# Patient Record
Sex: Female | Born: 1935 | Race: White | Hispanic: No | Marital: Married | Smoking: Never smoker
Health system: Southern US, Community
[De-identification: ages and names within clinical notes are randomized; demographics above are authoritative.]

## PROBLEM LIST (undated history)

## (undated) DIAGNOSIS — C50919 Malignant neoplasm of unspecified site of unspecified female breast: Secondary | ICD-10-CM

## (undated) DIAGNOSIS — R251 Tremor, unspecified: Secondary | ICD-10-CM

## (undated) DIAGNOSIS — G250 Essential tremor: Secondary | ICD-10-CM

## (undated) DIAGNOSIS — C760 Malignant neoplasm of head, face and neck: Secondary | ICD-10-CM

## (undated) DIAGNOSIS — E7800 Pure hypercholesterolemia, unspecified: Secondary | ICD-10-CM

## (undated) DIAGNOSIS — I10 Essential (primary) hypertension: Secondary | ICD-10-CM

## (undated) DIAGNOSIS — F319 Bipolar disorder, unspecified: Secondary | ICD-10-CM

## (undated) DIAGNOSIS — M1990 Unspecified osteoarthritis, unspecified site: Secondary | ICD-10-CM

## (undated) DIAGNOSIS — G629 Polyneuropathy, unspecified: Secondary | ICD-10-CM

## (undated) DIAGNOSIS — J45909 Unspecified asthma, uncomplicated: Secondary | ICD-10-CM

## (undated) HISTORY — DX: Polyneuropathy, unspecified: G62.9

## (undated) HISTORY — DX: Tremor, unspecified: R25.1

## (undated) HISTORY — DX: Malignant neoplasm of head, face and neck: C76.0

## (undated) HISTORY — DX: Malignant neoplasm of unspecified site of unspecified female breast: C50.919

## (undated) HISTORY — DX: Unspecified osteoarthritis, unspecified site: M19.90

## (undated) HISTORY — PX: ORIF TIBIAL SHAFT FRACTURE W/ PLATES AND SCREWS: SUR964

## (undated) HISTORY — DX: Bipolar disorder, unspecified: F31.9

## (undated) HISTORY — DX: Pure hypercholesterolemia, unspecified: E78.00

## (undated) HISTORY — DX: Essential (primary) hypertension: I10

## (undated) HISTORY — DX: Unspecified asthma, uncomplicated: J45.909

## (undated) HISTORY — PX: VAGINAL HYSTERECTOMY: SUR661

## (undated) HISTORY — DX: Essential tremor: G25.0

---

## 1996-11-23 DIAGNOSIS — C50919 Malignant neoplasm of unspecified site of unspecified female breast: Secondary | ICD-10-CM

## 1996-11-23 HISTORY — PX: BREAST LUMPECTOMY: SHX2

## 1996-11-23 HISTORY — DX: Malignant neoplasm of unspecified site of unspecified female breast: C50.919

## 2008-02-27 ENCOUNTER — Ambulatory Visit: Payer: Self-pay | Admitting: Family Medicine

## 2008-03-03 ENCOUNTER — Emergency Department: Payer: Self-pay | Admitting: Emergency Medicine

## 2008-03-03 ENCOUNTER — Other Ambulatory Visit: Payer: Self-pay

## 2008-11-12 ENCOUNTER — Ambulatory Visit: Payer: Self-pay | Admitting: Family Medicine

## 2009-04-15 ENCOUNTER — Ambulatory Visit: Payer: Self-pay | Admitting: Family Medicine

## 2009-07-26 ENCOUNTER — Ambulatory Visit: Payer: Self-pay | Admitting: Family Medicine

## 2010-02-06 ENCOUNTER — Ambulatory Visit: Payer: Self-pay | Admitting: Family Medicine

## 2010-05-29 ENCOUNTER — Ambulatory Visit: Payer: Self-pay | Admitting: Family Medicine

## 2011-01-06 ENCOUNTER — Ambulatory Visit: Payer: Self-pay

## 2011-02-04 ENCOUNTER — Ambulatory Visit: Payer: Self-pay | Admitting: Family Medicine

## 2011-07-02 ENCOUNTER — Ambulatory Visit: Payer: Self-pay | Admitting: Family Medicine

## 2011-07-06 ENCOUNTER — Ambulatory Visit: Payer: Self-pay | Admitting: Family Medicine

## 2012-10-04 ENCOUNTER — Ambulatory Visit: Payer: Self-pay | Admitting: Family Medicine

## 2012-10-11 ENCOUNTER — Ambulatory Visit: Payer: Self-pay | Admitting: Family Medicine

## 2013-03-17 ENCOUNTER — Telehealth: Payer: Self-pay

## 2013-03-17 NOTE — Telephone Encounter (Signed)
Pt has never been seen here before and has seen Dr. Katrinka Blazing before. She is wanting to discuss a recent diagnosis with her. She really misses Dr. Ival Bible Call back [phone]

## 2013-03-20 ENCOUNTER — Telehealth: Payer: Self-pay

## 2013-03-20 NOTE — Telephone Encounter (Signed)
Patient has been to Dr. Chandra Batch and Dr. Clelia Croft the neurologist. She misses Dr. Katrinka Blazing so much. Dr Clelia Croft is not big on medicine and did not think her condition with her hands was a big deal. Her condition has gotten worse this past year. She feels that it is starting to take away her quality of life. She feels that Dr. Clelia Croft did not properly treat her condition a year ago. Propranalol-Enderal was started a few days ago. She would like Dr. Katrinka Blazing to call her. She has an appt today with Dr. Clelia Croft today and is not wanting to go- she wants to confirm her feelings with Dr. Katrinka Blazing.

## 2013-03-20 NOTE — Telephone Encounter (Signed)
PT CALLED AGAIN WANTING TO LET YOU KNOW SHE LEFT THE NEUROLOGIST AND WOULD LIKE TO KNOW IF YOU COULD RECOMMEND A NEW ONE.  [phone]

## 2013-03-20 NOTE — Telephone Encounter (Signed)
PT STATES SHE IS A PT OF DR Katrinka Blazing WHEN SHE WAS IN [place] AND DR SMITH HAD REFERRED HER TO ANOTHER DR. SHE JUST WANT TO KNOW IF IT WAS UNUSUAL FOR THE DR TO WAIT A YEAR BEFORE THEY GAVE HER SOME MEDICINE. ALSO WANT DR Katrinka Blazing TO KNOW SHE MISS HER PLEASE CALL [phone]

## 2013-05-25 DIAGNOSIS — F319 Bipolar disorder, unspecified: Secondary | ICD-10-CM | POA: Insufficient documentation

## 2013-05-25 DIAGNOSIS — G629 Polyneuropathy, unspecified: Secondary | ICD-10-CM | POA: Insufficient documentation

## 2013-05-25 DIAGNOSIS — D329 Benign neoplasm of meninges, unspecified: Secondary | ICD-10-CM | POA: Insufficient documentation

## 2013-05-25 DIAGNOSIS — M1990 Unspecified osteoarthritis, unspecified site: Secondary | ICD-10-CM | POA: Insufficient documentation

## 2013-05-25 DIAGNOSIS — E7800 Pure hypercholesterolemia, unspecified: Secondary | ICD-10-CM | POA: Insufficient documentation

## 2013-05-25 DIAGNOSIS — J45909 Unspecified asthma, uncomplicated: Secondary | ICD-10-CM | POA: Insufficient documentation

## 2013-06-14 ENCOUNTER — Ambulatory Visit (INDEPENDENT_AMBULATORY_CARE_PROVIDER_SITE_OTHER): Payer: Medicare Other | Admitting: Neurology

## 2013-06-14 ENCOUNTER — Encounter: Payer: Self-pay | Admitting: Neurology

## 2013-06-14 VITALS — BP 134/85 | HR 70 | Ht 64.0 in | Wt 166.0 lb

## 2013-06-14 DIAGNOSIS — R42 Dizziness and giddiness: Secondary | ICD-10-CM

## 2013-06-14 DIAGNOSIS — R259 Unspecified abnormal involuntary movements: Secondary | ICD-10-CM

## 2013-06-14 DIAGNOSIS — G250 Essential tremor: Secondary | ICD-10-CM

## 2013-06-14 DIAGNOSIS — G609 Hereditary and idiopathic neuropathy, unspecified: Secondary | ICD-10-CM

## 2013-06-14 DIAGNOSIS — R251 Tremor, unspecified: Secondary | ICD-10-CM

## 2013-06-14 HISTORY — DX: Essential tremor: G25.0

## 2013-06-14 MED ORDER — TOPIRAMATE 50 MG PO TABS: ORAL_TABLET | ORAL | Status: DC

## 2013-06-14 NOTE — Patient Instructions (Addendum)
I think overall you are doing fairly well but I do want to suggest a few things today:  Remember to drink plenty of fluid, eat healthy meals and do not skip any meals. Try to eat protein with a every meal and eat a healthy snack such as fruit or nuts in between meals. Try to keep a regular sleep-wake schedule and try to exercise daily, particularly in the form of walking, 20-30 minutes a day, if you can.   As far as your medications are concerned, I would like to suggest Topamax 50 mg: Take half a pill each bedtime for one week, then one pill at bedtime daily for one week, then 1-1/2 pills at bedtime daily for one week, then 2 pills at bedtime daily thereafter. Common side effects reported are: Sedation, sleepiness, tingling, change in taste especially with carbonated drinks, and rare side effects are glaucoma, kidney stones, and problems with thinking including word finding difficulties.  As far as diagnostic testing: no new test from my end today.  I would like to see you back in 3 months, sooner if we need to. Please call us with any interim questions, concerns, problems, updates or refill requests.  Please also call us for any test results so we can go over those with you on the phone. Brett Canales is my clinical assistant and will answer any of your questions and relay your messages to me and also relay most of my messages to you.  Our phone number is [phone]. We also have an after hours call service for urgent matters and there is a physician on-call for urgent questions. For any emergencies you know to call 911 or go to the nearest emergency room.

## 2013-06-14 NOTE — Progress Notes (Signed)
Subjective:    Patient ID: Megan Flores is a 78 y.o. female.  HPI  Huston Foley, MD, PhD [facility] [address] [address] [place], [place] [address]  Dear Dr. Sherrie Mustache,   I saw your patient, Megan Flores, upon your kind request in my neurologic clinic today for initial consultation of her dizziness and tremors as well as neuropathy. The patient is unaccompanied today. As you know, Megan Flores is a very pleasant 78 year old right-handed woman with an underlying medical history of bipolar, peripheral neuropathy, hyperlipidemia, hypertension, tremor, and chronic lung disease as well as anxiety, who has been experiencing dizziness for the past 3 months. She has had recurrent headaches. She was recently started on propranolol by her neurologist for her tremors. She has not had any syncope or head trauma or recent illness or falls. She has had some low pulse rates. Her symptoms improved after she stopped the propranolol. There is no FHx of neuropathy She had a reaction to primidone and felt dizzy. She has not been on any medication for her tremors in the last 6 weeks, and she has also an intermittent head tremor. She has some balance issues, but has not fallen recently. She denies recurrent HAs. She has had a tremor for the past 2 years.   She has a remote Hx of meningioma and is due for a CTH with her new PCP soon.   Her Past Medical History Is Significant For: Past Medical History  Diagnosis Date  . Asthma   . Bipolar 1 disorder   . Peripheral neuropathy   . Osteoarthritis   . Cancer of breast   . Cancer of face   . HTN (hypertension)   . Hypercholesteremia   . Tremor     Her Past Surgical History Is Significant For: Past Surgical History  Procedure Laterality Date  . Vaginal hysterectomy      Her Family History Is Significant For: Family History  Problem Relation Age of Onset  . Cancer Mother   . Heart failure Father     Her  Social History Is Significant For: History   Social History  . Marital Status: Married    Spouse Name: N/A    Number of Children: N/A  . Years of Education: N/A   Social History Main Topics  . Smoking status: Never Smoker   . Smokeless tobacco: None  . Alcohol Use: No  . Drug Use: No  . Sexually Active: None   Other Topics Concern  . None   Social History Narrative  . None    Her Allergies Are:  Allergies  Allergen Reactions  . Primidone Other (See Comments)    dizziness  . Propranolol Anaphylaxis    hypotension  . Neosporin (Neomycin-Bacitracin Zn-Polymyx) Rash  . Polysporin (Bacitracin-Polymyxin B) Rash  :   Her Current Medications Are:  Outpatient Encounter Prescriptions as of 06/14/2013  Medication Sig Dispense Refill  . albuterol (PROVENTIL HFA;VENTOLIN HFA) 108 (90 BASE) MCG/ACT inhaler Inhale 2 puffs into the lungs every 6 (six) hours as needed for wheezing.      Marland Kitchen aspirin 81 MG tablet Take 81 mg by mouth daily.      . Calcium Citrate-Vitamin D (CITRACAL + D PO) Take 1 tablet by mouth 2 (two) times daily.      . carbamazepine (CARBATROL) 200 MG 12 hr capsule Take 200 mg by mouth 4 (four) times daily.      . diazepam (VALIUM) 5 MG tablet Take 5 mg  by mouth every 6 (six) hours as needed for anxiety.      . fish oil-omega-3 fatty acids 1000 MG capsule Take 2 g by mouth daily.      . Flaxseed, Linseed, (FLAX SEED OIL) 1000 MG CAPS Take 1 capsule by mouth 2 (two) times daily.      Marland Kitchen gabapentin (NEURONTIN) 300 MG capsule Take 300 mg by mouth 3 (three) times daily.      Marland Kitchen lisinopril (PRINIVIL,ZESTRIL) 5 MG tablet Take 5 mg by mouth daily.      . meclizine (ANTIVERT) 25 MG tablet Take 25 mg by mouth 4 (four) times daily.      . simvastatin (ZOCOR) 40 MG tablet Take 40 mg by mouth every evening.      Marland Kitchen Spacer/Aero-Holding Chambers (AEROCHAMBER PLUS) inhaler 1 each by Other route once. Use as instructed      . vitamin B-12 (CYANOCOBALAMIN) 500 MCG tablet Take 500 mcg by  mouth daily.      Marland Kitchen topiramate (TOPAMAX) 50 MG tablet 1/2 pill each bedtime x 1 week, then 1 pill nightly x 1 week, then 1-1/2 pills nightly x 1 week, then 2 pills nightly thereafter.  60 tablet  3   No facility-administered encounter medications on file as of 06/14/2013.   Review of Systems  HENT: Positive for hearing loss and tinnitus.   Neurological: Positive for tremors and numbness.    Objective:  Neurologic Exam  Physical Exam Physical Examination:   Filed Vitals:   06/14/13 1030  BP: 134/85  Pulse: 70    General Examination: The patient is a very pleasant 78 y.o. female in no acute distress. She appears well-developed and well-nourished and well groomed.   HEENT: Normocephalic, atraumatic, pupils are equal, round and reactive to light and accommodation. Funduscopic exam is normal with sharp disc margins noted. Extraocular tracking is good without limitation to gaze excursion or nystagmus noted. Normal smooth pursuit is noted. Hearing is mildly impaired. Tympanic membranes are clear bilaterally. Face is symmetric with normal facial animation and normal facial sensation. Speech is clear with no dysarthria noted. There is no hypophonia. There is no lip tremor, but she has a mild mild intermittent neck/head tremor. Neck is supple with full range of passive and active motion. There are no carotid bruits on auscultation. Oropharynx exam reveals: mild mouth dryness, adequate dental hygiene and no signignificant airway crowding. Mallampati is class II. Tongue protrudes centrally and palate elevates symmetrically.  Chest: Clear to auscultation without wheezing, rhonchi or crackles noted.  Heart: S1+S2+0, regular and normal without murmurs, rubs or gallops noted.   Abdomen: Soft, non-tender and non-distended with normal bowel sounds appreciated on auscultation.  Extremities: There is no pitting edema in the distal lower extremities bilaterally. Pedal pulses are intact.  Skin: Warm and  dry without trophic changes noted. There are no varicose veins.  Musculoskeletal: exam reveals no obvious joint deformities, tenderness or joint swelling or erythema.   Neurologically:  Mental status: The patient is awake, alert and oriented in all 4 spheres. Her memory, attention, language and knowledge are appropriate. There is no aphasia, agnosia, apraxia or anomia. Speech is clear with normal prosody and enunciation. Thought process is linear. Mood is congruent and affect is normal.  Cranial nerves are as described above under HEENT exam. In addition, shoulder shrug is normal with equal shoulder height noted. Motor exam: Normal bulk, strength and tone is noted. There is no drift, or rebound. She has no resting tremor. She has a  mild postural and action tremor in both UEs. On Archimedes spiral drawing, she has mild tremulousness and her handwriting is mildly tremulous and legible, and not micrographic. Romberg is negative except mild swaying. Reflexes are 1+ throughout except absent ankles. Toes are downgoing bilaterally. Fine motor skills are intact with normal finger taps, normal hand movements, normal rapid alternating patting, normal foot taps and normal foot agility.  Cerebellar testing shows no dysmetria or intention tremor on finger to nose testing. Heel to shin is unremarkable bilaterally. There is no truncal or gait ataxia.  Sensory exam is intact to light touch, pinprick, vibration, temperature sense and proprioception in the upper extremities with mild decrease in PP/Temp/vibration in the distal LEs to above the ankles.  Gait, station and balance are unremarkable. No veering to one side is noted. No leaning to one side is noted. Posture is age-appropriate and stance is narrow based. No problems turning are noted. She turns en bloc. Tandem walk is unremarkable. Intact toe and heel stance is noted.               Assessment and Plan:   In summary, Lorraina Spring is a very pleasant 78  y.o.-year old female with a history and physical exam c/w essential tremor. Her physical exam is stable and there is evidence of mild peripheral neuropathy, stable by history.   I had a long chat with the patient about my findings and the diagnosis of ET, the prognosis and treatment options. We talked about medical treatments and non-pharmacological approaches. We talked about maintaining a healthy lifestyle in general. I encouraged the patient to eat healthy, exercise daily and keep well hydrated, to keep a scheduled bedtime and wake time routine, to not skip any meals and eat healthy snacks in between meals and to have protein with every meal.   As far as further diagnostic testing is concerned, I suggested the following today: no new test. She is d/t have a CTH with her new PCP for a remote hx of meningioma.,  As far as medications are concerned, I recommended the following at this time: trial of Topamax, starting at 25 mg with gradual build up to 100 mg qHS. I advised her about potential SEs.  I answered all her questions today and the patient was in agreement with the above outlined plan. I would like to see the patient back in 3 months, sooner if the need arises and encouraged her to call with any interim questions, concerns, problems or updates and refill requests.   Thank you very much for allowing me to participate in the care of this nice patient. If I can be of any further assistance to you please do not hesitate to call me at [phone].  Sincerely,   Huston Foley, MD, PhD

## 2013-06-23 ENCOUNTER — Ambulatory Visit: Payer: Self-pay | Admitting: Family Medicine

## 2013-07-19 ENCOUNTER — Telehealth: Payer: Self-pay | Admitting: Neurology

## 2013-07-19 NOTE — Telephone Encounter (Signed)
FYI: Patient has decided to leave the practices and continue to go to [facility].

## 2013-09-29 ENCOUNTER — Ambulatory Visit: Payer: Medicare Other | Admitting: Neurology

## 2013-11-10 ENCOUNTER — Ambulatory Visit: Payer: Self-pay | Admitting: Family Medicine

## 2014-02-26 DIAGNOSIS — F05 Delirium due to known physiological condition: Secondary | ICD-10-CM | POA: Insufficient documentation

## 2014-03-03 DIAGNOSIS — F061 Catatonic disorder due to known physiological condition: Secondary | ICD-10-CM | POA: Insufficient documentation

## 2014-09-13 DIAGNOSIS — F5104 Psychophysiologic insomnia: Secondary | ICD-10-CM | POA: Insufficient documentation

## 2014-09-13 DIAGNOSIS — E871 Hypo-osmolality and hyponatremia: Secondary | ICD-10-CM | POA: Insufficient documentation

## 2014-12-04 ENCOUNTER — Ambulatory Visit: Payer: Self-pay | Admitting: Family Medicine

## 2015-05-01 ENCOUNTER — Ambulatory Visit
Admission: EM | Admit: 2015-05-01 | Discharge: 2015-05-01 | Disposition: A | Discharge status: Home / Self Care | Payer: Medicare Other

## 2015-05-01 DIAGNOSIS — S060X0A Concussion without loss of consciousness, initial encounter: Secondary | ICD-10-CM

## 2015-05-01 NOTE — ED Provider Notes (Signed)
CSN: [id number]     Arrival date & time 05/01/15  1856 History   None    Chief Complaint  Patient presents with  . Fall  . Head Injury   (Consider location/radiation/quality/duration/timing/severity/associated sxs/prior Treatment) HPI Patient presents for evaluation of head trauma. [place] [employer] [place] to [place] sustaining a contusion to the left side of her face and both knees. She denies any LOC, but complains of "not feeling quite right" and of feeling nauseated. She takes low-dose aspirin daily, but is otherwise not on anti-coagulants. She denies any loss of motor or sensory function. Pain is localized to the left temple and bilateral anterior knees without radiation.  Past Medical History  Diagnosis Date  . Asthma   . Bipolar 1 disorder   . Peripheral neuropathy   . Osteoarthritis   . Cancer of breast   . Cancer of face   . HTN (hypertension)   . Hypercholesteremia   . Tremor   . Essential tremor 06/14/2013   Past Surgical History  Procedure Laterality Date  . Vaginal hysterectomy     Family History  Problem Relation Age of Onset  . Cancer Mother   . Heart failure Father    History  Substance Use Topics  . Smoking status: Never Smoker   . Smokeless tobacco: Not on file  . Alcohol Use: No   OB History    No data available     Review of Systems  Constitutional: Negative for fever, chills, diaphoresis and fatigue.  HENT: Positive for facial swelling.   Eyes: Negative.   Respiratory: Negative.   Cardiovascular: Negative.   Gastrointestinal: Negative for nausea.  Musculoskeletal: Positive for joint swelling. Negative for back pain and neck pain.  Neurological: Positive for tremors. Negative for dizziness, seizures, syncope, speech difficulty, weakness, light-headedness, numbness and headaches.  Psychiatric/Behavioral: Negative.     Allergies  Primidone; Propranolol; Neosporin; Polysporin; and Topamax  Home Medications   Prior to Admission  medications   Medication Sig Start Date End Date Taking? Authorizing Provider  aspirin 81 MG tablet Take 81 mg by mouth daily.   Yes Historical Provider, MD  Calcium Citrate-Vitamin D (CITRACAL + D PO) Take 1 tablet by mouth 2 (two) times daily.   Yes Historical Provider, MD  carbamazepine (CARBATROL) 200 MG 12 hr capsule Take 200 mg by mouth 4 (four) times daily.   Yes Historical Provider, MD  fish oil-omega-3 fatty acids 1000 MG capsule Take 2 g by mouth daily.   Yes Historical Provider, MD  gabapentin (NEURONTIN) 300 MG capsule Take 300 mg by mouth 3 (three) times daily.   Yes Historical Provider, MD  simvastatin (ZOCOR) 40 MG tablet Take 40 mg by mouth every evening.   Yes Historical Provider, MD  vitamin B-12 (CYANOCOBALAMIN) 500 MCG tablet Take 500 mcg by mouth daily.   Yes Historical Provider, MD  albuterol (PROVENTIL HFA;VENTOLIN HFA) 108 (90 BASE) MCG/ACT inhaler Inhale 2 puffs into the lungs every 6 (six) hours as needed for wheezing.    Historical Provider, MD  diazepam (VALIUM) 5 MG tablet Take 5 mg by mouth every 6 (six) hours as needed for anxiety.    Historical Provider, MD  Flaxseed, Linseed, (FLAX SEED OIL) 1000 MG CAPS Take 1 capsule by mouth 2 (two) times daily.    Historical Provider, MD  lisinopril (PRINIVIL,ZESTRIL) 5 MG tablet Take 5 mg by mouth daily.    Historical Provider, MD  meclizine (ANTIVERT) 25 MG tablet Take 25 mg  by mouth 4 (four) times daily.    Historical Provider, MD  Spacer/Aero-Holding Chambers (AEROCHAMBER PLUS) inhaler 1 each by Other route once. Use as instructed    Historical Provider, MD  topiramate (TOPAMAX) 50 MG tablet 1/2 pill each bedtime x 1 week, then 1 pill nightly x 1 week, then 1-1/2 pills nightly x 1 week, then 2 pills nightly thereafter. 06/14/13   Star Age, MD   BP 154/69 mmHg  Pulse 72  Temp(Src) 97.7 F (36.5 C) (Oral)  Resp 16  Ht 5\' 4"  (1.626 m)  Wt 160 lb (72.576 kg)  BMI 27.45 kg/m2  SpO2 100% Physical Exam  Constitutional:  She is oriented to person, place, and time. She appears well-developed and well-nourished.  HENT:  Positive ecchymosis over the left temple with diffuse soft tissue swelling.  Eyes: EOM are normal. Pupils are equal, round, and reactive to light.  Neck: Normal range of motion.  Cardiovascular: Normal rate and regular rhythm.   Pulmonary/Chest: Effort normal and breath sounds normal.  Musculoskeletal:  Positive ecchymosis and soft tissue swelling over the left anterior knee. No instability or loss of motion.  Neurological: She is alert and oriented to person, place, and time. No cranial nerve deficit.  5/5 strength bilateral UE and bilateral LE  Skin: Skin is warm and dry.  Psychiatric: She has a normal mood and affect. Thought content normal.    ED Course  Procedures (including critical care time) Labs Review Labs Reviewed - No data to display  Imaging Review No results found.   MDM   1. Concussion, without loss of consciousness, initial encounter    Discussed injury and need for further imaging to rule out internal hemorrhage. Patient and husband understand she needs to be evaluated further at the ED and plan to proceed via private vehicle to [facility] for further evaluation.    Mathews Argyle, PA-C 05/01/15 2027

## 2015-05-01 NOTE — ED Notes (Signed)
States [place] and fell in [employer] parking [place] and hit left side of face. Denies LOC. + bruising. A&Ox3

## 2015-11-06 DIAGNOSIS — Z7189 Other specified counseling: Secondary | ICD-10-CM | POA: Insufficient documentation

## 2016-07-20 DIAGNOSIS — R296 Repeated falls: Secondary | ICD-10-CM | POA: Insufficient documentation

## 2016-12-02 DIAGNOSIS — R7301 Impaired fasting glucose: Secondary | ICD-10-CM | POA: Insufficient documentation

## 2017-07-20 ENCOUNTER — Other Ambulatory Visit: Payer: Self-pay | Admitting: Family Medicine

## 2017-07-20 DIAGNOSIS — Z1231 Encounter for screening mammogram for malignant neoplasm of breast: Secondary | ICD-10-CM

## 2017-07-29 ENCOUNTER — Other Ambulatory Visit: Payer: Self-pay | Admitting: Family Medicine

## 2017-07-29 DIAGNOSIS — N644 Mastodynia: Secondary | ICD-10-CM

## 2017-08-06 ENCOUNTER — Other Ambulatory Visit: Payer: Medicare Other

## 2017-08-12 ENCOUNTER — Ambulatory Visit
Admission: RE | Admit: 2017-08-12 | Discharge: 2017-08-12 | Disposition: A | Discharge status: Home / Self Care | Payer: Medicare Other | Source: Ambulatory Visit | Attending: Family Medicine | Admitting: Family Medicine

## 2017-08-12 DIAGNOSIS — R928 Other abnormal and inconclusive findings on diagnostic imaging of breast: Secondary | ICD-10-CM | POA: Insufficient documentation

## 2017-08-12 DIAGNOSIS — N644 Mastodynia: Secondary | ICD-10-CM | POA: Insufficient documentation

## 2017-08-12 HISTORY — DX: Malignant neoplasm of unspecified site of unspecified female breast: C50.919

## 2017-08-17 ENCOUNTER — Other Ambulatory Visit: Payer: Self-pay | Admitting: Family Medicine

## 2017-08-17 DIAGNOSIS — N6320 Unspecified lump in the left breast, unspecified quadrant: Secondary | ICD-10-CM

## 2017-08-17 DIAGNOSIS — R928 Other abnormal and inconclusive findings on diagnostic imaging of breast: Secondary | ICD-10-CM

## 2017-08-26 ENCOUNTER — Ambulatory Visit
Admission: RE | Admit: 2017-08-26 | Discharge: 2017-08-26 | Disposition: A | Discharge status: Home / Self Care | Payer: Medicare Other | Source: Ambulatory Visit | Attending: Family Medicine | Admitting: Family Medicine

## 2017-08-26 DIAGNOSIS — N6320 Unspecified lump in the left breast, unspecified quadrant: Secondary | ICD-10-CM | POA: Diagnosis present

## 2017-08-26 DIAGNOSIS — R928 Other abnormal and inconclusive findings on diagnostic imaging of breast: Secondary | ICD-10-CM

## 2017-08-26 DIAGNOSIS — N6324 Unspecified lump in the left breast, lower inner quadrant: Secondary | ICD-10-CM | POA: Diagnosis not present

## 2017-08-27 LAB — SURGICAL PATHOLOGY

## 2020-10-23 DIAGNOSIS — Z85828 Personal history of other malignant neoplasm of skin: Secondary | ICD-10-CM | POA: Insufficient documentation

## 2020-10-31 ENCOUNTER — Ambulatory Visit
Admission: RE | Admit: 2020-10-31 | Discharge: 2020-10-31 | Disposition: A | Discharge status: Home / Self Care | Payer: Self-pay | Source: Ambulatory Visit | Attending: Radiation Oncology | Admitting: Radiation Oncology

## 2020-10-31 ENCOUNTER — Encounter (INDEPENDENT_AMBULATORY_CARE_PROVIDER_SITE_OTHER): Payer: Self-pay

## 2020-10-31 ENCOUNTER — Ambulatory Visit
Admission: RE | Admit: 2020-10-31 | Discharge: 2020-10-31 | Disposition: A | Discharge status: Home / Self Care | Payer: Medicare Other | Source: Ambulatory Visit | Attending: Radiation Oncology | Admitting: Radiation Oncology

## 2020-10-31 ENCOUNTER — Other Ambulatory Visit: Payer: Self-pay

## 2020-10-31 ENCOUNTER — Other Ambulatory Visit: Payer: Self-pay | Admitting: *Deleted

## 2020-10-31 VITALS — BP 142/81 | HR 114 | Temp 97.5°F | Wt 151.0 lb

## 2020-10-31 DIAGNOSIS — M1990 Unspecified osteoarthritis, unspecified site: Secondary | ICD-10-CM | POA: Diagnosis not present

## 2020-10-31 DIAGNOSIS — F319 Bipolar disorder, unspecified: Secondary | ICD-10-CM | POA: Insufficient documentation

## 2020-10-31 DIAGNOSIS — R918 Other nonspecific abnormal finding of lung field: Secondary | ICD-10-CM | POA: Diagnosis not present

## 2020-10-31 DIAGNOSIS — Z853 Personal history of malignant neoplasm of breast: Secondary | ICD-10-CM | POA: Diagnosis not present

## 2020-10-31 DIAGNOSIS — Z79899 Other long term (current) drug therapy: Secondary | ICD-10-CM | POA: Insufficient documentation

## 2020-10-31 DIAGNOSIS — C44329 Squamous cell carcinoma of skin of other parts of face: Secondary | ICD-10-CM | POA: Diagnosis present

## 2020-10-31 DIAGNOSIS — Z7982 Long term (current) use of aspirin: Secondary | ICD-10-CM | POA: Diagnosis not present

## 2020-10-31 DIAGNOSIS — E7800 Pure hypercholesterolemia, unspecified: Secondary | ICD-10-CM | POA: Diagnosis not present

## 2020-10-31 DIAGNOSIS — J45909 Unspecified asthma, uncomplicated: Secondary | ICD-10-CM | POA: Insufficient documentation

## 2020-10-31 DIAGNOSIS — G629 Polyneuropathy, unspecified: Secondary | ICD-10-CM | POA: Diagnosis not present

## 2020-10-31 NOTE — Consult Note (Signed)
NEW PATIENT EVALUATION  Name: Megan Flores  MRN: [id number]  Date:   10/31/2020     DOB: September 03, 1935   This 85 y.o. female patient presents to the clinic for initial evaluation of squamous cell carcinoma the right temple.  REFERRING PHYSICIAN: Hortencia Pilar, MD  CHIEF COMPLAINT:  Chief Complaint  Patient presents with  . 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   PREVIOUS INVESTIGATIONS:  CT scans reviewed Pathology report reviewed Clinical notes reviewed  HPI: Patient is a an 85 year old female who presented with a increasing nodular mass in her right temple.  She underwent biopsy back in November showing moderate to poorly differentiated squamous cell carcinoma with infiltrative growth pattern and edge involvement.  Mass was not fixed to the temple.She underwent a CT scan of the soft tissue of the head and neck showing a 7 mm soft tissue density nodule in the right parotid gland which was nonspecific suspicious.  No cervical lymph nodes were noted.  She had some several small pulmonary nodules up to 7 mm although majority were 2 to 3 mm favored to be postinflammatory postinfectious.  She is declined Mohs surgeries now referred to radiation oncology for opinion.  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    PAST SURGICAL HISTORY:  Past Surgical History:  Procedure Laterality Date  . BREAST LUMPECTOMY Left 1998   pos  . VAGINAL HYSTERECTOMY      FAMILY HISTORY: family history includes Breast cancer (age of onset: 82) in her mother; Cancer in her mother; Heart failure in her father; Ovarian cancer (age of  onset: 96) in her sister.  SOCIAL HISTORY:  reports that she has never smoked. She does not have any smokeless tobacco history on file. She reports that she does not drink alcohol and does not use drugs.  ALLERGIES: Levofloxacin, Primidone, Propranolol, Neomycin-bacitracin-polymyxin [bacitracin-neomycin-polymyxin], Neosporin [neomycin-bacitracin zn-polymyx], Polysporin [bacitracin-polymyxin b], Aspirin, and Topamax [topiramate]  MEDICATIONS:  Current Outpatient Medications  Medication Sig Dispense Refill  . aspirin 81 MG tablet Take 81 mg by mouth daily.    . Calcium Citrate-Vitamin D (CITRACAL + D PO) Take 1 tablet by mouth 2 (two) times daily.    . divalproex (DEPAKOTE) 125 MG DR tablet Take 125 mg by mouth 2 (two) times daily.    . Flaxseed, Linseed, (FLAX SEED OIL) 1000 MG CAPS Take 1 capsule by mouth 2 (two) times daily.    Marland Kitchen lisinopril (PRINIVIL,ZESTRIL) 5 MG tablet Take 5 mg by mouth daily.    . simvastatin (ZOCOR) 40 MG tablet Take 40 mg by mouth daily.    . vitamin B-12 (CYANOCOBALAMIN) 500 MCG tablet Take 500 mcg by mouth daily.    Marland Kitchen albuterol (PROVENTIL HFA;VENTOLIN HFA) 108 (90 BASE) MCG/ACT inhaler Inhale 2 puffs into the lungs every 6 (six) hours as needed for wheezing. (Patient not taking: Reported on 10/31/2020)    . carbamazepine (CARBATROL) 200 MG 12 hr capsule Take 200 mg by mouth 4 (four) times daily. (Patient not taking: Reported on 10/31/2020)    . diazepam (VALIUM) 5 MG tablet Take 5 mg by mouth every 6 (six) hours as needed for anxiety. (Patient not taking: Reported on 10/31/2020)    .  fish oil-omega-3 fatty acids 1000 MG capsule Take 2 g by mouth daily. (Patient not taking: Reported on 10/31/2020)    . gabapentin (NEURONTIN) 300 MG capsule Take 300 mg by mouth 3 (three) times daily. (Patient not taking: Reported on 10/31/2020)    . meclizine (ANTIVERT) 25 MG tablet Take 25 mg by mouth 4 (four) times daily. (Patient not taking: Reported on 10/31/2020)    . Spacer/Aero-Holding  Chambers (AEROCHAMBER PLUS) inhaler 1 each by Other route once. Use as instructed    . topiramate (TOPAMAX) 50 MG tablet 1/2 pill each bedtime x 1 week, then 1 pill nightly x 1 week, then 1-1/2 pills nightly x 1 week, then 2 pills nightly thereafter. (Patient not taking: Reported on 10/31/2020) 60 tablet 3   No current facility-administered medications for this encounter.    ECOG PERFORMANCE STATUS:  0 - Asymptomatic  REVIEW OF SYSTEMS: Patient does have bipolar disorder. Patient denies any weight loss, fatigue, weakness, fever, chills or night sweats. Patient denies any loss of vision, blurred vision. Patient denies any ringing  of the ears or hearing loss. No irregular heartbeat. Patient denies heart murmur or history of fainting. Patient denies any chest pain or pain radiating to her upper extremities. Patient denies any shortness of breath, difficulty breathing at night, cough or hemoptysis. Patient denies any swelling in the lower legs. Patient denies any nausea vomiting, vomiting of blood, or coffee ground material in the vomitus. Patient denies any stomach pain. Patient states has had normal bowel movements no significant constipation or diarrhea. Patient denies any dysuria, hematuria or significant nocturia. Patient denies any problems walking, swelling in the joints or loss of balance. Patient denies any skin changes, loss of hair or loss of weight. Patient denies any excessive worrying or anxiety or significant depression. Patient denies any problems with insomnia. Patient denies excessive thirst, polyuria, polydipsia. Patient denies any swollen glands, patient denies easy bruising or easy bleeding. Patient denies any recent infections, allergies or URI. Patient "s visual fields have not changed significantly in recent time.   PHYSICAL EXAM: BP (!) 142/81   Pulse (!) 114   Temp (!) 97.5 F (36.4 C) (Tympanic)   Wt 151 lb (68.5 kg)   BMI 25.92 kg/m  Slightly raised slightly ulcerative mass  on the right temple nonfixed to the scalp.  No evidence of submental cervical or supraclavicular adenopathy.  Well-developed well-nourished patient in NAD. HEENT reveals PERLA, EOMI, discs not visualized.  Oral cavity is clear. No oral mucosal lesions are identified. Neck is clear without evidence of cervical or supraclavicular adenopathy. Lungs are clear to A&P. Cardiac examination is essentially unremarkable with regular rate and rhythm without murmur rub or thrill. Abdomen is benign with no organomegaly or masses noted. Motor sensory and DTR levels are equal and symmetric in the upper and lower extremities. Cranial nerves II through XII are grossly intact. Proprioception is intact. No peripheral adenopathy or edema is identified. No motor or sensory levels are noted. Crude visual fields are within normal range.  LABORATORY DATA: Pathology report reviewed    RADIOLOGY RESULTS: CT scan reviewed   IMPRESSION: Squamous cell carcinoma the right temple in an 85 year old female who has declined Mohs chemosurgery.  PLAN: At this time elect go with electron-beam therapy to her right temple.  Would plan on delivering 50 Gray over 5 weeks.  Risks and benefits of tree including skin reaction possible slight fatigue and hyperpigmentation of skin in this area all reviewed in detail with the patient.  She seems to comprehend my treatment plan well.  I have set her up for CT simulation early next week.  I would like to take this opportunity to thank you for allowing me to participate in the care of your patient.Noreene Filbert, MD

## 2020-11-05 ENCOUNTER — Ambulatory Visit
Admission: RE | Admit: 2020-11-05 | Discharge: 2020-11-05 | Disposition: A | Discharge status: Home / Self Care | Payer: Medicare Other | Source: Ambulatory Visit | Attending: Radiation Oncology | Admitting: Radiation Oncology

## 2020-11-05 DIAGNOSIS — C44329 Squamous cell carcinoma of skin of other parts of face: Secondary | ICD-10-CM | POA: Diagnosis present

## 2020-11-05 DIAGNOSIS — Z510 Encounter for antineoplastic radiation therapy: Secondary | ICD-10-CM | POA: Diagnosis not present

## 2020-11-06 DIAGNOSIS — C44329 Squamous cell carcinoma of skin of other parts of face: Secondary | ICD-10-CM | POA: Diagnosis not present

## 2020-11-12 ENCOUNTER — Ambulatory Visit
Admission: RE | Admit: 2020-11-12 | Discharge: 2020-11-12 | Disposition: A | Discharge status: Home / Self Care | Payer: Medicare Other | Source: Ambulatory Visit | Attending: Radiation Oncology | Admitting: Radiation Oncology

## 2020-11-12 DIAGNOSIS — C44329 Squamous cell carcinoma of skin of other parts of face: Secondary | ICD-10-CM | POA: Diagnosis not present

## 2020-11-13 ENCOUNTER — Ambulatory Visit
Admission: RE | Admit: 2020-11-13 | Discharge: 2020-11-13 | Disposition: A | Discharge status: Home / Self Care | Payer: Medicare Other | Source: Ambulatory Visit | Attending: Radiation Oncology | Admitting: Radiation Oncology

## 2020-11-13 DIAGNOSIS — C44329 Squamous cell carcinoma of skin of other parts of face: Secondary | ICD-10-CM | POA: Diagnosis not present

## 2020-11-14 ENCOUNTER — Ambulatory Visit
Admission: RE | Admit: 2020-11-14 | Discharge: 2020-11-14 | Disposition: A | Discharge status: Home / Self Care | Payer: Medicare Other | Source: Ambulatory Visit | Attending: Radiation Oncology | Admitting: Radiation Oncology

## 2020-11-14 DIAGNOSIS — C44329 Squamous cell carcinoma of skin of other parts of face: Secondary | ICD-10-CM | POA: Diagnosis not present

## 2020-11-18 ENCOUNTER — Ambulatory Visit
Admission: RE | Admit: 2020-11-18 | Discharge: 2020-11-18 | Disposition: A | Discharge status: Home / Self Care | Payer: Medicare Other | Source: Ambulatory Visit | Attending: Radiation Oncology | Admitting: Radiation Oncology

## 2020-11-18 DIAGNOSIS — C44329 Squamous cell carcinoma of skin of other parts of face: Secondary | ICD-10-CM | POA: Diagnosis not present

## 2020-11-19 ENCOUNTER — Ambulatory Visit
Admission: RE | Admit: 2020-11-19 | Discharge: 2020-11-19 | Disposition: A | Discharge status: Home / Self Care | Payer: Medicare Other | Source: Ambulatory Visit | Attending: Radiation Oncology | Admitting: Radiation Oncology

## 2020-11-19 DIAGNOSIS — C44329 Squamous cell carcinoma of skin of other parts of face: Secondary | ICD-10-CM | POA: Diagnosis not present

## 2020-11-20 ENCOUNTER — Ambulatory Visit
Admission: RE | Admit: 2020-11-20 | Discharge: 2020-11-20 | Disposition: A | Discharge status: Home / Self Care | Payer: Medicare Other | Source: Ambulatory Visit | Attending: Radiation Oncology | Admitting: Radiation Oncology

## 2020-11-20 DIAGNOSIS — C44329 Squamous cell carcinoma of skin of other parts of face: Secondary | ICD-10-CM | POA: Diagnosis not present

## 2020-11-21 ENCOUNTER — Ambulatory Visit
Admission: RE | Admit: 2020-11-21 | Discharge: 2020-11-21 | Disposition: A | Discharge status: Home / Self Care | Payer: Medicare Other | Source: Ambulatory Visit | Attending: Radiation Oncology | Admitting: Radiation Oncology

## 2020-11-21 DIAGNOSIS — C44329 Squamous cell carcinoma of skin of other parts of face: Secondary | ICD-10-CM | POA: Diagnosis not present

## 2020-11-25 ENCOUNTER — Ambulatory Visit
Admission: RE | Admit: 2020-11-25 | Discharge: 2020-11-25 | Disposition: A | Discharge status: Home / Self Care | Payer: Medicare Other | Source: Ambulatory Visit | Attending: Radiation Oncology | Admitting: Radiation Oncology

## 2020-11-25 DIAGNOSIS — C44329 Squamous cell carcinoma of skin of other parts of face: Secondary | ICD-10-CM | POA: Diagnosis not present

## 2020-11-25 DIAGNOSIS — Z510 Encounter for antineoplastic radiation therapy: Secondary | ICD-10-CM | POA: Insufficient documentation

## 2020-11-26 ENCOUNTER — Ambulatory Visit
Admission: RE | Admit: 2020-11-26 | Discharge: 2020-11-26 | Disposition: A | Discharge status: Home / Self Care | Payer: Medicare Other | Source: Ambulatory Visit | Attending: Radiation Oncology | Admitting: Radiation Oncology

## 2020-11-26 DIAGNOSIS — C44329 Squamous cell carcinoma of skin of other parts of face: Secondary | ICD-10-CM | POA: Diagnosis not present

## 2020-11-27 ENCOUNTER — Ambulatory Visit
Admission: RE | Admit: 2020-11-27 | Discharge: 2020-11-27 | Disposition: A | Discharge status: Home / Self Care | Payer: Medicare Other | Source: Ambulatory Visit | Attending: Radiation Oncology | Admitting: Radiation Oncology

## 2020-11-27 DIAGNOSIS — C44329 Squamous cell carcinoma of skin of other parts of face: Secondary | ICD-10-CM | POA: Diagnosis not present

## 2020-11-28 ENCOUNTER — Ambulatory Visit
Admission: RE | Admit: 2020-11-28 | Discharge: 2020-11-28 | Disposition: A | Discharge status: Home / Self Care | Payer: Medicare Other | Source: Ambulatory Visit | Attending: Radiation Oncology | Admitting: Radiation Oncology

## 2020-11-28 DIAGNOSIS — C44329 Squamous cell carcinoma of skin of other parts of face: Secondary | ICD-10-CM | POA: Diagnosis not present

## 2020-11-29 ENCOUNTER — Ambulatory Visit
Admission: RE | Admit: 2020-11-29 | Discharge: 2020-11-29 | Disposition: A | Discharge status: Home / Self Care | Payer: Medicare Other | Source: Ambulatory Visit | Attending: Radiation Oncology | Admitting: Radiation Oncology

## 2020-11-29 DIAGNOSIS — C44329 Squamous cell carcinoma of skin of other parts of face: Secondary | ICD-10-CM | POA: Diagnosis not present

## 2020-12-02 ENCOUNTER — Ambulatory Visit
Admission: RE | Admit: 2020-12-02 | Discharge: 2020-12-02 | Disposition: A | Discharge status: Home / Self Care | Payer: Medicare Other | Source: Ambulatory Visit | Attending: Radiation Oncology | Admitting: Radiation Oncology

## 2020-12-02 DIAGNOSIS — C44329 Squamous cell carcinoma of skin of other parts of face: Secondary | ICD-10-CM | POA: Diagnosis not present

## 2020-12-03 ENCOUNTER — Ambulatory Visit
Admission: RE | Admit: 2020-12-03 | Discharge: 2020-12-03 | Disposition: A | Discharge status: Home / Self Care | Payer: Medicare Other | Source: Ambulatory Visit | Attending: Radiation Oncology | Admitting: Radiation Oncology

## 2020-12-03 DIAGNOSIS — C44329 Squamous cell carcinoma of skin of other parts of face: Secondary | ICD-10-CM | POA: Diagnosis not present

## 2020-12-04 ENCOUNTER — Ambulatory Visit
Admission: RE | Admit: 2020-12-04 | Discharge: 2020-12-04 | Disposition: A | Discharge status: Home / Self Care | Payer: Medicare Other | Source: Ambulatory Visit | Attending: Radiation Oncology | Admitting: Radiation Oncology

## 2020-12-04 DIAGNOSIS — C44329 Squamous cell carcinoma of skin of other parts of face: Secondary | ICD-10-CM | POA: Diagnosis not present

## 2020-12-05 ENCOUNTER — Ambulatory Visit
Admission: RE | Admit: 2020-12-05 | Discharge: 2020-12-05 | Disposition: A | Discharge status: Home / Self Care | Payer: Medicare Other | Source: Ambulatory Visit | Attending: Radiation Oncology | Admitting: Radiation Oncology

## 2020-12-05 DIAGNOSIS — C44329 Squamous cell carcinoma of skin of other parts of face: Secondary | ICD-10-CM | POA: Diagnosis not present

## 2020-12-06 ENCOUNTER — Ambulatory Visit
Admission: RE | Admit: 2020-12-06 | Discharge: 2020-12-06 | Disposition: A | Discharge status: Home / Self Care | Payer: Medicare Other | Source: Ambulatory Visit | Attending: Radiation Oncology | Admitting: Radiation Oncology

## 2020-12-06 DIAGNOSIS — C44329 Squamous cell carcinoma of skin of other parts of face: Secondary | ICD-10-CM | POA: Diagnosis not present

## 2020-12-09 ENCOUNTER — Ambulatory Visit: Payer: Medicare Other

## 2020-12-10 ENCOUNTER — Ambulatory Visit
Admission: RE | Admit: 2020-12-10 | Discharge: 2020-12-10 | Disposition: A | Discharge status: Home / Self Care | Payer: Medicare Other | Source: Ambulatory Visit | Attending: Radiation Oncology | Admitting: Radiation Oncology

## 2020-12-10 DIAGNOSIS — C44329 Squamous cell carcinoma of skin of other parts of face: Secondary | ICD-10-CM | POA: Diagnosis not present

## 2020-12-11 ENCOUNTER — Ambulatory Visit
Admission: RE | Admit: 2020-12-11 | Discharge: 2020-12-11 | Disposition: A | Discharge status: Home / Self Care | Payer: Medicare Other | Source: Ambulatory Visit | Attending: Radiation Oncology | Admitting: Radiation Oncology

## 2020-12-11 DIAGNOSIS — C44329 Squamous cell carcinoma of skin of other parts of face: Secondary | ICD-10-CM | POA: Diagnosis not present

## 2020-12-12 ENCOUNTER — Ambulatory Visit
Admission: RE | Admit: 2020-12-12 | Discharge: 2020-12-12 | Disposition: A | Discharge status: Home / Self Care | Payer: Medicare Other | Source: Ambulatory Visit | Attending: Radiation Oncology | Admitting: Radiation Oncology

## 2020-12-12 DIAGNOSIS — C44329 Squamous cell carcinoma of skin of other parts of face: Secondary | ICD-10-CM | POA: Diagnosis not present

## 2020-12-13 ENCOUNTER — Ambulatory Visit
Admission: RE | Admit: 2020-12-13 | Discharge: 2020-12-13 | Disposition: A | Discharge status: Home / Self Care | Payer: Medicare Other | Source: Ambulatory Visit | Attending: Radiation Oncology | Admitting: Radiation Oncology

## 2020-12-13 DIAGNOSIS — C44329 Squamous cell carcinoma of skin of other parts of face: Secondary | ICD-10-CM | POA: Diagnosis not present

## 2020-12-16 ENCOUNTER — Ambulatory Visit
Admission: RE | Admit: 2020-12-16 | Discharge: 2020-12-16 | Disposition: A | Discharge status: Home / Self Care | Payer: Medicare Other | Source: Ambulatory Visit | Attending: Radiation Oncology | Admitting: Radiation Oncology

## 2020-12-16 DIAGNOSIS — C44329 Squamous cell carcinoma of skin of other parts of face: Secondary | ICD-10-CM | POA: Diagnosis not present

## 2020-12-17 ENCOUNTER — Ambulatory Visit
Admission: RE | Admit: 2020-12-17 | Discharge: 2020-12-17 | Disposition: A | Discharge status: Home / Self Care | Payer: Medicare Other | Source: Ambulatory Visit | Attending: Radiation Oncology | Admitting: Radiation Oncology

## 2020-12-17 DIAGNOSIS — C44329 Squamous cell carcinoma of skin of other parts of face: Secondary | ICD-10-CM | POA: Diagnosis not present

## 2020-12-18 ENCOUNTER — Ambulatory Visit: Payer: Medicare Other

## 2020-12-18 ENCOUNTER — Ambulatory Visit
Admission: RE | Admit: 2020-12-18 | Discharge: 2020-12-18 | Disposition: A | Discharge status: Home / Self Care | Payer: Medicare Other | Source: Ambulatory Visit | Attending: Radiation Oncology | Admitting: Radiation Oncology

## 2020-12-18 DIAGNOSIS — C44329 Squamous cell carcinoma of skin of other parts of face: Secondary | ICD-10-CM | POA: Diagnosis not present

## 2020-12-19 ENCOUNTER — Ambulatory Visit
Admission: RE | Admit: 2020-12-19 | Discharge: 2020-12-19 | Disposition: A | Discharge status: Home / Self Care | Payer: Medicare Other | Source: Ambulatory Visit | Attending: Radiation Oncology | Admitting: Radiation Oncology

## 2020-12-19 DIAGNOSIS — C44329 Squamous cell carcinoma of skin of other parts of face: Secondary | ICD-10-CM | POA: Diagnosis not present

## 2021-01-27 ENCOUNTER — Encounter: Payer: Self-pay | Admitting: Radiation Oncology

## 2021-01-27 ENCOUNTER — Ambulatory Visit
Admission: RE | Admit: 2021-01-27 | Discharge: 2021-01-27 | Disposition: A | Discharge status: Home / Self Care | Payer: Medicare Other | Source: Ambulatory Visit | Attending: Radiation Oncology | Admitting: Radiation Oncology

## 2021-01-27 ENCOUNTER — Other Ambulatory Visit: Payer: Self-pay

## 2021-01-27 DIAGNOSIS — R4189 Other symptoms and signs involving cognitive functions and awareness: Secondary | ICD-10-CM | POA: Insufficient documentation

## 2021-01-27 DIAGNOSIS — Z923 Personal history of irradiation: Secondary | ICD-10-CM | POA: Insufficient documentation

## 2021-01-27 DIAGNOSIS — C44329 Squamous cell carcinoma of skin of other parts of face: Secondary | ICD-10-CM | POA: Diagnosis not present

## 2021-01-27 NOTE — Progress Notes (Signed)
Radiation Oncology Follow up Note  Name: Megan Flores   Date:   01/27/2021 MRN:  [id number] DOB: [birth year]    This 86 y.o. female presents to the clinic today for 1 month follow-up status post radiation therapy for squamous cell carcinoma the right temple.  REFERRING PROVIDER: Hortencia Pilar, MD  HPI: Patient is an 86 year old female now seen at 1 month having completed radiation therapy with electron-beam for a squamous cell the right temple seen today in routine follow-up she is doing well she is had a complete response still slight area of erythema in the area of treatment otherwise no complaints.  Person accompanying patient today states that she has been somewhat off since radiation although patient has obvious cognitive decline which has been witnessed by both my staff and myself prior to initiating radiation therapy.  I have also explained electron-beam does not enter the brain is a very superficial beam. COMPLICATIONS OF TREATMENT: none  FOLLOW UP COMPLIANCE: keeps appointments   PHYSICAL EXAM:  BP (!) (P) 153/88 (BP Location: Left Arm, Patient Position: Sitting)   Pulse (P) 99   Temp (!) (P) 96.6 F (35.9 C)   Resp (P) 18   Wt (P) 153 lb 1.6 oz (69.4 kg)   BMI (P) 26.28 kg/m  No evidence of cervical or submental adenopathy is identified.  She has had a complete response in the area of the right temple with some residual erythematous change in skin noted.  Well-developed well-nourished patient in NAD. HEENT reveals PERLA, EOMI, discs not visualized.  Oral cavity is clear. No oral mucosal lesions are identified. Neck is clear without evidence of cervical or supraclavicular adenopathy. Lungs are clear to A&P. Cardiac examination is essentially unremarkable with regular rate and rhythm without murmur rub or thrill. Abdomen is benign with no organomegaly or masses noted. Motor sensory and DTR levels are equal and symmetric in the upper and lower extremities. Cranial nerves II  through XII are grossly intact. Proprioception is intact. No peripheral adenopathy or edema is identified. No motor or sensory levels are noted. Crude visual fields are within normal range.  RADIOLOGY RESULTS: No current films to review  PLAN: Present time patient is doing well with a complete response by electron-beam therapy to her squamous cell carcinoma.  I have instructed the person to contact her PMD about her cognitive issues.  I have also referring her to Dr. Nehemiah Massed for follow-up of her skin cancers.  I have asked to see her back in 4 to 5 months for follow-up.  Family knows to call with any concerns.  I would like to take this opportunity to thank you for allowing me to participate in the care of your patient.Noreene Filbert, MD

## 2021-05-08 ENCOUNTER — Ambulatory Visit: Payer: Medicare Other | Admitting: Dermatology

## 2021-07-10 ENCOUNTER — Ambulatory Visit: Payer: Medicare Other | Admitting: Radiation Oncology

## 2021-07-30 ENCOUNTER — Encounter (HOSPITAL_COMMUNITY): Payer: Self-pay

## 2021-07-30 ENCOUNTER — Emergency Department (HOSPITAL_COMMUNITY): Payer: Medicare Other

## 2021-07-30 ENCOUNTER — Emergency Department (HOSPITAL_COMMUNITY)
Admission: EM | Admit: 2021-07-30 | Discharge: 2021-07-31 | Disposition: A | Discharge status: Home / Self Care | Payer: Medicare Other | Attending: Emergency Medicine | Admitting: Emergency Medicine

## 2021-07-30 DIAGNOSIS — Z85828 Personal history of other malignant neoplasm of skin: Secondary | ICD-10-CM | POA: Insufficient documentation

## 2021-07-30 DIAGNOSIS — Z853 Personal history of malignant neoplasm of breast: Secondary | ICD-10-CM | POA: Insufficient documentation

## 2021-07-30 DIAGNOSIS — S0003XA Contusion of scalp, initial encounter: Secondary | ICD-10-CM | POA: Insufficient documentation

## 2021-07-30 DIAGNOSIS — Z7951 Long term (current) use of inhaled steroids: Secondary | ICD-10-CM | POA: Diagnosis not present

## 2021-07-30 DIAGNOSIS — I10 Essential (primary) hypertension: Secondary | ICD-10-CM | POA: Diagnosis not present

## 2021-07-30 DIAGNOSIS — Z79899 Other long term (current) drug therapy: Secondary | ICD-10-CM | POA: Insufficient documentation

## 2021-07-30 DIAGNOSIS — I5189 Other ill-defined heart diseases: Secondary | ICD-10-CM | POA: Insufficient documentation

## 2021-07-30 DIAGNOSIS — E871 Hypo-osmolality and hyponatremia: Secondary | ICD-10-CM | POA: Diagnosis not present

## 2021-07-30 DIAGNOSIS — S0990XA Unspecified injury of head, initial encounter: Secondary | ICD-10-CM | POA: Diagnosis present

## 2021-07-30 DIAGNOSIS — W182XXA Fall in (into) shower or empty bathtub, initial encounter: Secondary | ICD-10-CM | POA: Diagnosis not present

## 2021-07-30 DIAGNOSIS — Z7982 Long term (current) use of aspirin: Secondary | ICD-10-CM | POA: Insufficient documentation

## 2021-07-30 DIAGNOSIS — J45909 Unspecified asthma, uncomplicated: Secondary | ICD-10-CM | POA: Insufficient documentation

## 2021-07-30 DIAGNOSIS — R600 Localized edema: Secondary | ICD-10-CM | POA: Insufficient documentation

## 2021-07-30 DIAGNOSIS — W19XXXA Unspecified fall, initial encounter: Secondary | ICD-10-CM

## 2021-07-30 DIAGNOSIS — M25521 Pain in right elbow: Secondary | ICD-10-CM | POA: Insufficient documentation

## 2021-07-30 LAB — CBC WITH DIFFERENTIAL/PLATELET
Abs Immature Granulocytes: 0.04 10*3/uL (ref 0.00–0.07)
Basophils Absolute: 0.1 10*3/uL (ref 0.0–0.1)
Basophils Relative: 1 %
Eosinophils Absolute: 0.2 10*3/uL (ref 0.0–0.5)
Eosinophils Relative: 2 %
HCT: 42.8 % (ref 36.0–46.0)
Hemoglobin: 14 g/dL (ref 12.0–15.0)
Immature Granulocytes: 0 %
Lymphocytes Relative: 20 %
Lymphs Abs: 2 10*3/uL (ref 0.7–4.0)
MCH: 28.5 pg (ref 26.0–34.0)
MCHC: 32.7 g/dL (ref 30.0–36.0)
MCV: 87.2 fL (ref 80.0–100.0)
Monocytes Absolute: 1.5 10*3/uL — ABNORMAL HIGH (ref 0.1–1.0)
Monocytes Relative: 15 %
Neutro Abs: 6.3 10*3/uL (ref 1.7–7.7)
Neutrophils Relative %: 62 %
Platelets: 254 10*3/uL (ref 150–400)
RBC: 4.91 MIL/uL (ref 3.87–5.11)
RDW: 13.2 % (ref 11.5–15.5)
WBC: 10.1 10*3/uL (ref 4.0–10.5)
nRBC: 0 % (ref 0.0–0.2)

## 2021-07-30 NOTE — ED Triage Notes (Signed)
Patient arrives via EMS from [facility] after witnessed fall out of bathtub. Patient did hit head on right side, no LOC. No deformity noted and no blood thinners.  EMS vitals: BP 160/82 Hx HTN, HR 83, SPO2 99%, RR 16, CBG 103

## 2021-07-30 NOTE — ED Provider Notes (Signed)
[facility] DEPT Provider Note   CSN: [id number] Arrival date & time: 07/30/21  2200     History Chief Complaint  Patient presents with   Megan Flores is a 86 y.o. female with a hx of hypertension, hypercholesterolemia, asthma, breast cancer S/p lumpectomy, bipolar disorder, frequent falls, and peripheral neuropathy who presents to the ED via EMS from facility S/p mechanical fall just PTA with complaints of headache. Patient states she slipped getting out of the bath tub and hit her head, did not have LOC. Per EMS staff witnessed event- no LOC, @ mental status baseline. Patient complaints of right sided headache and right elbow pain. Elbow pain worse with movement, no alleviating factors. She currently feels like she has to urinate, but does not necessarily have abdominal pain. Denies chest pain, vomiting, hip pain, back pain, or neck pain. No anticoagulation per EMS. Patient reports her neuropathy & LE edema are chronic.   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[place])    Essential tremor 06/14/2013   HTN (hypertension)    Hypercholesteremia    Osteoarthritis    Peripheral neuropathy    Tremor     Patient Active Problem List   Diagnosis Date Noted   Personal history of other malignant neoplasm of skin 10/23/2020   Fasting hyperglycemia 12/02/2016   Frequent falls 07/20/2016   Advanced directives, counseling/discussion 11/06/2015   Chronic hyponatremia 09/13/2014   Chronic insomnia 09/13/2014   Catatonia 03/03/2014   Delirium due to multiple etiologies, acute, hyperactive 02/26/2014   Essential tremor 06/14/2013   Reactive airway disease 05/25/2013   Bipolar 1 disorder ([place]) 05/25/2013   Meningioma ([place]) 05/25/2013   Osteoarthritis 05/25/2013   Peripheral neuropathy 05/25/2013    Pure hypercholesterolemia 05/25/2013    Past Surgical History:  Procedure Laterality Date   BREAST LUMPECTOMY Left 1998   pos   VAGINAL HYSTERECTOMY       OB History   No obstetric history on file.     Family History  Problem Relation Age of Onset   Cancer Mother    Breast cancer Mother 21   Heart failure Father    Ovarian cancer Sister 20    Social History   Tobacco Use   Smoking status: Never   Smokeless tobacco: Never  Substance Use Topics   Alcohol use: No   Drug use: No    Home Medications Prior to Admission medications   Medication Sig Start Date End Date Taking? Authorizing Provider  albuterol (PROVENTIL HFA;VENTOLIN HFA) 108 (90 BASE) MCG/ACT inhaler Inhale 2 puffs into the lungs every 6 (six) hours as needed for wheezing. Patient not taking: No sig reported    [provider]  aspirin 81 MG tablet Take 81 mg by mouth daily.    [provider]  Calcium Citrate-Vitamin D (CITRACAL + D PO) Take 1 tablet by mouth 2 (two) times daily.    [provider]  carbamazepine (CARBATROL) 200 MG 12 hr capsule Take 200 mg by mouth 4 (four) times daily. Patient not taking: No sig reported    [provider]  clonazePAM (KLONOPIN) 0.5 MG tablet Take 0.5 mg by mouth daily as needed. 01/24/21   [provider]  diazepam (VALIUM) 5 MG tablet Take  5 mg by mouth every 6 (six) hours as needed for anxiety. Patient not taking: No sig reported    [provider]  divalproex (DEPAKOTE) 125 MG DR tablet Take 125 mg by mouth 2 (two) times daily.    [provider]  fish oil-omega-3 fatty acids 1000 MG capsule Take 2 g by mouth daily. Patient not taking: No sig reported    [provider]  Flaxseed, Linseed, (FLAX SEED OIL) 1000 MG CAPS Take 1 capsule by mouth 2 (two) times daily.    [provider]  gabapentin (NEURONTIN) 300 MG capsule Take 300 mg by mouth 3 (three) times daily. Patient not taking: No sig  reported    [provider]  hydrochlorothiazide (HYDRODIURIL) 25 MG tablet Take 25 mg by mouth daily. 01/24/21   [provider]  lisinopril (PRINIVIL,ZESTRIL) 5 MG tablet Take 5 mg by mouth daily.    [provider]  losartan (COZAAR) 25 MG tablet Take 25 mg by mouth daily. 12/18/20   [provider]  meclizine (ANTIVERT) 25 MG tablet Take 25 mg by mouth 4 (four) times daily.    [provider]  QUEtiapine (SEROQUEL) 100 MG tablet Take 100 mg by mouth at bedtime. 12/16/20   [provider]  simvastatin (ZOCOR) 40 MG tablet Take 40 mg by mouth daily. 10/07/20   [provider]  Spacer/Aero-Holding Chambers (AEROCHAMBER PLUS) inhaler 1 each by Other route once. Use as instructed    [provider]  topiramate (TOPAMAX) 50 MG tablet 1/2 pill each bedtime x 1 week, then 1 pill nightly x 1 week, then 1-1/2 pills nightly x 1 week, then 2 pills nightly thereafter. Patient not taking: No sig reported 06/14/13   Star Age, MD  vitamin B-12 (CYANOCOBALAMIN) 500 MCG tablet Take 500 mcg by mouth daily.    [provider]    Allergies    Levofloxacin, Primidone, Propranolol, Neomycin-bacitracin-polymyxin [bacitracin-neomycin-polymyxin], Neosporin [neomycin-bacitracin zn-polymyx], Polysporin [bacitracin-polymyxin b], Aspirin, and Topamax [topiramate]  Review of Systems   Review of Systems  Constitutional:  Negative for chills and fever.  Eyes:  Negative for visual disturbance.  Respiratory:  Negative for cough and shortness of breath.   Cardiovascular:  Positive for leg swelling (chronic). Negative for chest pain.  Gastrointestinal:  Negative for abdominal pain and vomiting.  Musculoskeletal:  Positive for arthralgias.  Neurological:  Positive for headaches.       Positive for chronic neuropathy.   All other systems reviewed and are negative.  Physical Exam Updated Vital Signs BP (!) 150/66   Pulse 80   Temp 98.7 F  (37.1 C) (Oral)   Resp 14   SpO2 99%   Physical Exam Vitals and nursing note reviewed.  Constitutional:      General: She is not in acute distress.    Appearance: She is not toxic-appearing.  HENT:     Head:     Comments: R parietal scalp hematoma. No active bleeding. No racoon eyes or battle sign.     Ears:     Comments: No hemotympanum.  Eyes:     Extraocular Movements: Extraocular movements intact.     Pupils: Pupils are equal, round, and reactive to light.  Cardiovascular:     Rate and Rhythm: Normal rate and regular rhythm.     Comments: 2+ radial pulses.  Pulmonary:     Effort: Pulmonary effort is normal.     Breath sounds: Normal breath sounds.  Chest:     Chest  wall: No tenderness.  Abdominal:     General: There is no distension.     Palpations: Abdomen is soft.     Tenderness: There is no abdominal tenderness. There is no guarding or rebound.  Musculoskeletal:     Cervical back: Normal range of motion and neck supple. No tenderness.     Right lower leg: Edema present.     Left lower leg: Edema present.     Comments: Ues: Abrasion to the right posterior elbow. No active bleeding or SQ tissue exposure. Intact AROM thorughout major joints of the upper extremities with the exception of right elbow and shoulder flexion limited secondary to pain in the elbow per patient. Tender over the right posterior elbow. Otherwise nontender.  Back: No point/focal vertebral tenderness.  Les: Edema to bilateral lower extremities which is symmetric.  Able to lift bilateral lower extremities off of the stretcher without difficulty, patient able to fully flex and extend the knees and fully plantar/dorsiflex ankles, she is able to move all digits.  She has no tenderness to palpation throughout.  Skin:    General: Skin is warm and dry.  Neurological:     Mental Status: She is alert.     Comments: Alert. Clear speech. No focal droop. Sensation grossly intact to light touch x 4. 5/5 grip  strength & strength with plantar/dorsiflexion bilaterally.     ED Results / Procedures / Treatments   Labs (all labs ordered are listed, but only abnormal results are displayed) Labs Reviewed  CBC WITH DIFFERENTIAL/PLATELET - Abnormal; Notable for the following components:      Result Value   Monocytes Absolute 1.5 (*)    All other components within normal limits  URINALYSIS, ROUTINE W REFLEX MICROSCOPIC - Abnormal; Notable for the following components:   Color, Urine COLORLESS (*)    Specific Gravity, Urine 1.003 (*)    pH 9.0 (*)    Hgb urine dipstick SMALL (*)    All other components within normal limits  COMPREHENSIVE METABOLIC PANEL - Abnormal; Notable for the following components:   Sodium 133 (*)    Chloride 96 (*)    Glucose, Bld 115 (*)    All other components within normal limits    EKG None  Radiology DG Shoulder Right  Result Date: 07/30/2021 CLINICAL DATA:  Fall EXAM: RIGHT SHOULDER - 2+ VIEW COMPARISON:  None. FINDINGS: Degenerative changes in the right [facility] joint. No acute bony abnormality. Specifically, no fracture, subluxation, or dislocation. IMPRESSION: No acute bony abnormality. Electronically Signed   By: Rolm Baptise M.D.   On: 07/30/2021 23:11   DG Elbow Complete Right  Result Date: 07/30/2021 CLINICAL DATA:  Witnessed fall out bathtub. EXAM: RIGHT ELBOW - COMPLETE 3+ VIEW COMPARISON:  None. FINDINGS: There is no evidence of fracture, dislocation, or joint effusion. Small chronically fragmented olecranon spur. There is no evidence of arthropathy or other focal bone abnormality. Soft tissues are unremarkable. IMPRESSION: No fracture or subluxation of the right elbow. Electronically Signed   By: Keith Rake M.D.   On: 07/30/2021 22:56   CT Head Wo Contrast  Result Date: 07/30/2021 CLINICAL DATA:  Head trauma, minor (Age >= 65y). Witnessed fall from bathtub with head injury EXAM: CT HEAD WITHOUT CONTRAST CT CERVICAL SPINE WITHOUT CONTRAST TECHNIQUE:  Multidetector CT imaging of the head and cervical spine was performed following the standard protocol without intravenous contrast. Multiplanar CT image reconstructions of the cervical spine were also generated. 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 A polypoid intraventricular mass is seen within the atrium of the right lateral ventricle, new from prior examination, demonstrating speckled calcification possibly representing a a choroid plexus neoplasm, such as a choroid plexus papilloma, or ependymoma. This measures 15 x 20 by 19 mm. Mild ventriculomegaly is present, slightly progressive since prior examination, but commensurate with the degree of parenchymal atrophy and likely reflecting the sequela of central atrophy. No definite evidence of hydrocephalus. Mild periventricular white matter changes are present likely reflecting the sequela of small vessel ischemia. No abnormal mass effect or midline shift. No acute intracranial hemorrhage or infarct. The cerebellum is unremarkable. Vascular: No asymmetric hyperdense vasculature at the skull base. Skull: Intact. Sinuses/Orbits: The paranasal sinuses are clear. The orbits are unremarkable. Other: Mastoid air cells and middle ear cavities are clear. Small right parietal scalp hematoma is present. CT CERVICAL SPINE FINDINGS Alignment: Normal cervical lordosis. 2 mm anterolisthesis of C2 upon C3 is likely degenerative in nature. Skull base and vertebrae: Craniocervical alignment is normal. The atlantodental interval is not widened. No acute fracture of the cervical spine. Vertebral body height has been preserved. Soft tissues and spinal canal: No prevertebral fluid or swelling. No visible canal hematoma. Disc levels: There is intervertebral disc space narrowing and endplate remodeling at 075-GRM  and to a lesser extent C4-5 and C6-7 in keeping with changes of mild degenerative disc disease. The prevertebral soft tissues are not thickened on sagittal reformats. Review of the axial images demonstrates multilevel uncovertebral and facet arthrosis resulting in mild right neuroforaminal narrowing at C5-6. Remaining neural foramina are widely patent. Spinal canal is widely patent. Upper chest: Unremarkable Other: None IMPRESSION: No acute intracranial injury. No calvarial fracture. Small right parietal scalp hematoma. Slight interval increase in size of left tentorial mass within the posterior fossa most in keeping with a meningioma. No significant mass effect upon the adjacent cerebellar hemisphere. Interval development of an intraventricular mass within the atrium of the right lateral ventricle with associated calcification possibly representing a choroid plexus neoplasm or ependymoma. Nonemergent dedicated contrast enhanced MRI examination is recommended for further characterization. No definite evidence of associated hydrocephalus. Mild senescent change. No acute fracture or listhesis of the cervical spine. Electronically Signed   By: Fidela Salisbury M.D.   On: 07/30/2021 23:35   CT Cervical Spine Wo Contrast  Result Date: 07/30/2021 CLINICAL DATA:  Head trauma, minor (Age >= 65y). Witnessed fall from bathtub with head injury EXAM: CT HEAD WITHOUT CONTRAST CT CERVICAL SPINE WITHOUT CONTRAST TECHNIQUE: Multidetector CT imaging of the head and cervical spine was performed following the standard protocol without intravenous contrast. Multiplanar CT image reconstructions of the cervical spine were also generated. 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 A polypoid intraventricular mass is seen within the atrium of the right lateral  ventricle, new from prior examination, demonstrating speckled calcification possibly representing a a choroid plexus neoplasm, such as a choroid plexus papilloma, or ependymoma. This measures 15 x 20 by 19 mm. Mild ventriculomegaly is present, slightly progressive since prior examination, but commensurate with the degree of parenchymal atrophy and likely reflecting the sequela of central atrophy. No definite evidence of hydrocephalus. Mild periventricular white matter changes are present  likely reflecting the sequela of small vessel ischemia. No abnormal mass effect or midline shift. No acute intracranial hemorrhage or infarct. The cerebellum is unremarkable. Vascular: No asymmetric hyperdense vasculature at the skull base. Skull: Intact. Sinuses/Orbits: The paranasal sinuses are clear. The orbits are unremarkable. Other: Mastoid air cells and middle ear cavities are clear. Small right parietal scalp hematoma is present. CT CERVICAL SPINE FINDINGS Alignment: Normal cervical lordosis. 2 mm anterolisthesis of C2 upon C3 is likely degenerative in nature. Skull base and vertebrae: Craniocervical alignment is normal. The atlantodental interval is not widened. No acute fracture of the cervical spine. Vertebral body height has been preserved. Soft tissues and spinal canal: No prevertebral fluid or swelling. No visible canal hematoma. Disc levels: There is intervertebral disc space narrowing and endplate remodeling at 075-GRM and to a lesser extent C4-5 and C6-7 in keeping with changes of mild degenerative disc disease. The prevertebral soft tissues are not thickened on sagittal reformats. Review of the axial images demonstrates multilevel uncovertebral and facet arthrosis resulting in mild right neuroforaminal narrowing at C5-6. Remaining neural foramina are widely patent. Spinal canal is widely patent. Upper chest: Unremarkable Other: None IMPRESSION: No acute intracranial injury. No calvarial fracture. Small right parietal  scalp hematoma. Slight interval increase in size of left tentorial mass within the posterior fossa most in keeping with a meningioma. No significant mass effect upon the adjacent cerebellar hemisphere. Interval development of an intraventricular mass within the atrium of the right lateral ventricle with associated calcification possibly representing a choroid plexus neoplasm or ependymoma. Nonemergent dedicated contrast enhanced MRI examination is recommended for further characterization. No definite evidence of associated hydrocephalus. Mild senescent change. No acute fracture or listhesis of the cervical spine. Electronically Signed   By: Fidela Salisbury M.D.   On: 07/30/2021 23:35    Procedures Procedures   Medications Ordered in ED Medications  acetaminophen (TYLENOL) tablet 650 mg (650 mg Oral Given 07/31/21 0202)  LORazepam (ATIVAN) injection 0.5 mg (0.5 mg Intravenous Given 07/31/21 [id number])    ED Course  I have reviewed the triage vital signs and the nursing notes.  Pertinent labs & imaging results that were available during my care of the patient were reviewed by me and considered in my medical decision making (see chart for details).    MDM Rules/Calculators/A&P                           Patient presents to the ED for evaluation status post fall.  Nontoxic, mildly hypertensive, vitals otherwise unremarkable.  Additional history obtained:  Additional history obtained from chart review & nursing note review.   Lab Tests:  I Ordered, reviewed, and interpreted labs, which included:  CBC: Fairly unremarkable CMP: Mild hyponatremia/chloremia, renal function preserved. Urinalysis: Small hemoglobin present, 0-5 RBC, no UTI.  Imaging Studies ordered:  I ordered imaging studies which included CT head/Cspine & x-rays of the right elbow/shoulder, I independently reviewed, formal radiology impression shows:  CT head/Cspine: No acute intracranial injury. No calvarial fracture. Small right  parietal scalp hematoma. Slight interval increase in size of left tentorial mass within the posterior fossa most in keeping with a meningioma. No significant mass effect upon the adjacent cerebellar hemisphere. Interval development of an intraventricular mass within the atrium of the right lateral ventricle with associated calcification possibly representing a choroid plexus neoplasm or ependymoma. Nonemergent dedicated contrast enhanced MRI examination is recommended for further characterization. No definite evidence of associated hydrocephalus. Mild senescent  change. No acute fracture or listhesis of the cervical spine.  R shoulder x-ray: No acute bony abnormality.  R elbow x- ray: No fracture or subluxation of the right elbow.  ED Course:  No signs of serious head, neck, or back injury.  CT head without bleed, CT cervical spine without fracture, no point/focal midline vertebral tenderness or focal neurologic deficits.  No chest/abdominal tenderness on initial or repeat exam.  Right upper extremity x-rays are negative for fracture or dislocation, patient is neurovascularly intact distally.  She is able to lift her lower extremities off of the bed without difficulty.  She is able to ambulate short distances without assistance and is able to weight-bear therefore I do not suspect a hip fracture at this time.  Patient did become agitated while waiting for results, she takes benzodiazepines, she was given 0.5 mg of IV Ativan with improvement.  Her CT scan of her head does show interval development of an intraventricular mass within the atrium of the right lateral ventricle possibly representing a choroid plexus neoplasm or ependymoma-patient will need close outpatient follow-up with MRI for further assessment.  I discussed results, treatment plan, need for follow-up, and return precautions with the patient and her daughter via telephone. Provided opportunity for questions,confirmed understanding and are in  agreement with plan.    Findings and plan of care discussed with supervising physician Dr. Gilford Raid who has evaluated the patient & is in agreement.    Portions of this note were generated with [employer]. Dictation errors may occur despite best attempts at proofreading.  Final Clinical Impression(s) / ED Diagnoses Final diagnoses:  Fall, initial encounter  Hematoma of right parietal scalp, initial encounter  Right ventricular mass    Rx / DC Orders ED Discharge Orders     None        Amaryllis Dyke, PA-C 07/31/21 [id number]    Isla Pence, MD 08/02/21 1513

## 2021-07-31 LAB — URINALYSIS, ROUTINE W REFLEX MICROSCOPIC
Bacteria, UA: NONE SEEN
Bilirubin Urine: NEGATIVE
Glucose, UA: NEGATIVE mg/dL
Ketones, ur: NEGATIVE mg/dL
Leukocytes,Ua: NEGATIVE
Nitrite: NEGATIVE
Protein, ur: NEGATIVE mg/dL
Specific Gravity, Urine: 1.003 — ABNORMAL LOW (ref 1.005–1.030)
pH: 9 — ABNORMAL HIGH (ref 5.0–8.0)

## 2021-07-31 LAB — COMPREHENSIVE METABOLIC PANEL
ALT: 13 U/L (ref 0–44)
AST: 22 U/L (ref 15–41)
Albumin: 3.7 g/dL (ref 3.5–5.0)
Alkaline Phosphatase: 53 U/L (ref 38–126)
Anion gap: 11 (ref 5–15)
BUN: 13 mg/dL (ref 8–23)
CO2: 26 mmol/L (ref 22–32)
Calcium: 9.1 mg/dL (ref 8.9–10.3)
Chloride: 96 mmol/L — ABNORMAL LOW (ref 98–111)
Creatinine, Ser: 0.71 mg/dL (ref 0.44–1.00)
GFR, Estimated: >60 mL/min (ref >60)
Glucose, Bld: 115 mg/dL — ABNORMAL HIGH (ref 70–99)
Potassium: 4.2 mmol/L (ref 3.5–5.1)
Sodium: 133 mmol/L — ABNORMAL LOW (ref 135–145)
Total Bilirubin: 0.6 mg/dL (ref 0.3–1.2)
Total Protein: 6.7 g/dL (ref 6.5–8.1)

## 2021-07-31 MED ORDER — ACETAMINOPHEN 325 MG PO TABS
650.0000 mg | ORAL_TABLET | Freq: Once | ORAL | Status: AC
  Administered 2021-07-31: 650 mg via ORAL
  Filled 2021-07-31: qty 2

## 2021-07-31 MED ORDER — LORAZEPAM 2 MG/ML IJ SOLN
0.5000 mg | Freq: Once | INTRAMUSCULAR | Status: AC
  Administered 2021-07-31: 0.5 mg via INTRAVENOUS
  Filled 2021-07-31: qty 1

## 2021-07-31 NOTE — ED Notes (Signed)
[facility] contacted. Transport arranged for patient back to facility.

## 2021-07-31 NOTE — ED Notes (Addendum)
Patient was able to get up and stand with x 1 assist. Pt was assisted to wheelchair and taken to bathroom. Pt was able to ambulate from hallway into the bathroom and back to wheelchair from bathroom without assistance. Gait was unsteady. Pt endorses using a cane to ambulate at home.

## 2021-07-31 NOTE — Discharge Instructions (Addendum)
You were seen in the emergency department today after a fall.  Your imaging did not show any signs of injury.  Your labs did show that your sodium and your chloride were very mildly low and that she did have some hemoglobin in your urine, please have this rechecked by your primary care provider.  Your CT scan of your head showed that you have a small hematoma on your scalp.  Your CT scan of your head also showed some increase in size of your likely meningioma that has been seen on prior imaging.  You do however have a new mass in your brain to your right ventricle, it is unclear exactly what this is, this may represent a cancerous process, it is very important that you follow-up with your primary care provider to have further evaluation of this with an MRI with contrast.  Please follow-up with primary care soon as possible.  Return to the emergency department for new or worsening symptoms including but not limited to new or worsening pain, chest pain, abdominal pain, blood in your urine or stool, passing out, change in mental status, fever, inability to keep fluids down, trouble walking, or any other concerns.

## 2021-08-07 ENCOUNTER — Other Ambulatory Visit: Payer: Self-pay

## 2021-08-07 ENCOUNTER — Emergency Department (HOSPITAL_COMMUNITY)
Admission: EM | Admit: 2021-08-07 | Discharge: 2021-08-08 | Discharge status: Against Medical Advice | Payer: Medicare Other | Source: Home / Self Care

## 2021-08-07 ENCOUNTER — Emergency Department (HOSPITAL_COMMUNITY): Payer: Medicare Other

## 2021-08-07 ENCOUNTER — Encounter (HOSPITAL_COMMUNITY): Payer: Self-pay | Admitting: Emergency Medicine

## 2021-08-07 DIAGNOSIS — M25552 Pain in left hip: Secondary | ICD-10-CM | POA: Insufficient documentation

## 2021-08-07 DIAGNOSIS — M25551 Pain in right hip: Secondary | ICD-10-CM | POA: Insufficient documentation

## 2021-08-07 DIAGNOSIS — R519 Headache, unspecified: Secondary | ICD-10-CM | POA: Insufficient documentation

## 2021-08-07 DIAGNOSIS — Z5321 Procedure and treatment not carried out due to patient leaving prior to being seen by health care provider: Secondary | ICD-10-CM | POA: Insufficient documentation

## 2021-08-07 DIAGNOSIS — E871 Hypo-osmolality and hyponatremia: Secondary | ICD-10-CM | POA: Diagnosis not present

## 2021-08-07 DIAGNOSIS — R4182 Altered mental status, unspecified: Secondary | ICD-10-CM | POA: Diagnosis not present

## 2021-08-07 LAB — CBC WITH DIFFERENTIAL/PLATELET
Abs Immature Granulocytes: 0.03 10*3/uL (ref 0.00–0.07)
Basophils Absolute: 0.1 10*3/uL (ref 0.0–0.1)
Basophils Relative: 1 %
Eosinophils Absolute: 0.2 10*3/uL (ref 0.0–0.5)
Eosinophils Relative: 2 %
HCT: 40.3 % (ref 36.0–46.0)
Hemoglobin: 13 g/dL (ref 12.0–15.0)
Immature Granulocytes: 0 %
Lymphocytes Relative: 26 %
Lymphs Abs: 2.3 10*3/uL (ref 0.7–4.0)
MCH: 28.3 pg (ref 26.0–34.0)
MCHC: 32.3 g/dL (ref 30.0–36.0)
MCV: 87.6 fL (ref 80.0–100.0)
Monocytes Absolute: 1.2 10*3/uL — ABNORMAL HIGH (ref 0.1–1.0)
Monocytes Relative: 14 %
Neutro Abs: 5 10*3/uL (ref 1.7–7.7)
Neutrophils Relative %: 57 %
Platelets: 274 10*3/uL (ref 150–400)
RBC: 4.6 MIL/uL (ref 3.87–5.11)
RDW: 13.6 % (ref 11.5–15.5)
WBC: 8.9 10*3/uL (ref 4.0–10.5)
nRBC: 0 % (ref 0.0–0.2)

## 2021-08-07 LAB — COMPREHENSIVE METABOLIC PANEL
ALT: 10 U/L (ref 0–44)
AST: 16 U/L (ref 15–41)
Albumin: 3.8 g/dL (ref 3.5–5.0)
Alkaline Phosphatase: 51 U/L (ref 38–126)
Anion gap: 11 (ref 5–15)
BUN: 17 mg/dL (ref 8–23)
CO2: 25 mmol/L (ref 22–32)
Calcium: 9.4 mg/dL (ref 8.9–10.3)
Chloride: 94 mmol/L — ABNORMAL LOW (ref 98–111)
Creatinine, Ser: 0.76 mg/dL (ref 0.44–1.00)
GFR, Estimated: >60 mL/min (ref >60)
Glucose, Bld: 97 mg/dL (ref 70–99)
Potassium: 4 mmol/L (ref 3.5–5.1)
Sodium: 130 mmol/L — ABNORMAL LOW (ref 135–145)
Total Bilirubin: 0.8 mg/dL (ref 0.3–1.2)
Total Protein: 6.7 g/dL (ref 6.5–8.1)

## 2021-08-07 NOTE — ED Triage Notes (Addendum)
Patient here from [facility], patient with multiple falls this week.  This is the third fall this week.  Patient having bilateral hip pain and having headache, did hit head.  Patient also with UTI odor.

## 2021-08-07 NOTE — ED Provider Notes (Signed)
Emergency Medicine Provider Triage Evaluation Note  HANORA SCRUTON , a 86 y.o. female  was evaluated in triage.  Pt complains of fall at hte facility pta. Per ems, pt with multiple falls. Pt reports pain to hips and head.  Review of Systems  Positive: Head pain, hip pain Negative: vomiting  Physical Exam  There were no vitals taken for this visit. Gen:   Awake, no distress   Resp:  Normal effort  MSK:   Moves extremities without difficulty  Other:  Clear speech, ttp to the bilat hips  Medical Decision Making  Medically screening exam initiated at 9:52 PM.  Appropriate orders placed.  Judianne Juhas Molinelli was informed that the remainder of the evaluation will be completed by another provider, this initial triage assessment does not replace that evaluation, and the importance of remaining in the ED until their evaluation is complete.     Bishop Dublin 08/07/21 2153    Daleen Bo, MD 08/09/21 1153

## 2021-08-08 NOTE — ED Notes (Signed)
Pt husband came in and gave her her morning meds pt was drinking soda in lobby. Pt husband is now sitting with pt because she is a fall risk

## 2021-08-08 NOTE — ED Notes (Signed)
Pt now wanting to leave with husband

## 2021-08-08 NOTE — ED Notes (Addendum)
Pt want to stay and informed husband.

## 2021-08-08 NOTE — ED Notes (Signed)
Daughter Elie Confer [phone] would like an update

## 2021-08-09 ENCOUNTER — Inpatient Hospital Stay (HOSPITAL_COMMUNITY)
Admission: EM | Admit: 2021-08-09 | Discharge: 2021-08-11 | DRG: 641 | Disposition: A | Discharge status: Nursing Home (Includes ICF) | Payer: Medicare Other | Source: Skilled Nursing Facility | Attending: Internal Medicine | Admitting: Internal Medicine

## 2021-08-09 ENCOUNTER — Emergency Department (HOSPITAL_COMMUNITY): Payer: Medicare Other

## 2021-08-09 DIAGNOSIS — Z820 Family history of epilepsy and other diseases of the nervous system: Secondary | ICD-10-CM

## 2021-08-09 DIAGNOSIS — Z853 Personal history of malignant neoplasm of breast: Secondary | ICD-10-CM

## 2021-08-09 DIAGNOSIS — R680 Hypothermia, not associated with low environmental temperature: Secondary | ICD-10-CM | POA: Diagnosis present

## 2021-08-09 DIAGNOSIS — E872 Acidosis: Secondary | ICD-10-CM | POA: Diagnosis present

## 2021-08-09 DIAGNOSIS — W06XXXA Fall from bed, initial encounter: Secondary | ICD-10-CM | POA: Diagnosis present

## 2021-08-09 DIAGNOSIS — E7800 Pure hypercholesterolemia, unspecified: Secondary | ICD-10-CM | POA: Diagnosis present

## 2021-08-09 DIAGNOSIS — E871 Hypo-osmolality and hyponatremia: Principal | ICD-10-CM | POA: Diagnosis present

## 2021-08-09 DIAGNOSIS — Z85828 Personal history of other malignant neoplasm of skin: Secondary | ICD-10-CM

## 2021-08-09 DIAGNOSIS — E875 Hyperkalemia: Secondary | ICD-10-CM | POA: Diagnosis present

## 2021-08-09 DIAGNOSIS — J45909 Unspecified asthma, uncomplicated: Secondary | ICD-10-CM | POA: Diagnosis present

## 2021-08-09 DIAGNOSIS — Y92092 Bedroom in other non-institutional residence as the place of occurrence of the external cause: Secondary | ICD-10-CM

## 2021-08-09 DIAGNOSIS — G250 Essential tremor: Secondary | ICD-10-CM | POA: Diagnosis present

## 2021-08-09 DIAGNOSIS — F0390 Unspecified dementia without behavioral disturbance: Secondary | ICD-10-CM | POA: Diagnosis present

## 2021-08-09 DIAGNOSIS — M1990 Unspecified osteoarthritis, unspecified site: Secondary | ICD-10-CM | POA: Diagnosis present

## 2021-08-09 DIAGNOSIS — I10 Essential (primary) hypertension: Secondary | ICD-10-CM | POA: Diagnosis present

## 2021-08-09 DIAGNOSIS — Z8249 Family history of ischemic heart disease and other diseases of the circulatory system: Secondary | ICD-10-CM

## 2021-08-09 DIAGNOSIS — F319 Bipolar disorder, unspecified: Secondary | ICD-10-CM | POA: Diagnosis present

## 2021-08-09 DIAGNOSIS — Z90710 Acquired absence of both cervix and uterus: Secondary | ICD-10-CM

## 2021-08-09 DIAGNOSIS — Z9181 History of falling: Secondary | ICD-10-CM

## 2021-08-09 DIAGNOSIS — G609 Hereditary and idiopathic neuropathy, unspecified: Secondary | ICD-10-CM | POA: Diagnosis present

## 2021-08-09 DIAGNOSIS — Z20822 Contact with and (suspected) exposure to covid-19: Secondary | ICD-10-CM | POA: Diagnosis present

## 2021-08-09 DIAGNOSIS — D329 Benign neoplasm of meninges, unspecified: Secondary | ICD-10-CM | POA: Diagnosis present

## 2021-08-09 DIAGNOSIS — R4182 Altered mental status, unspecified: Secondary | ICD-10-CM | POA: Diagnosis present

## 2021-08-09 DIAGNOSIS — Z803 Family history of malignant neoplasm of breast: Secondary | ICD-10-CM

## 2021-08-09 DIAGNOSIS — Z79899 Other long term (current) drug therapy: Secondary | ICD-10-CM

## 2021-08-09 DIAGNOSIS — T68XXXA Hypothermia, initial encounter: Secondary | ICD-10-CM

## 2021-08-09 LAB — VALPROIC ACID LEVEL: Valproic Acid Lvl: 54 ug/mL (ref 50.0–100.0)

## 2021-08-09 LAB — COMPREHENSIVE METABOLIC PANEL
ALT: 8 U/L (ref 0–44)
AST: 42 U/L — ABNORMAL HIGH (ref 15–41)
Albumin: 3.6 g/dL (ref 3.5–5.0)
Alkaline Phosphatase: 48 U/L (ref 38–126)
Anion gap: 12 (ref 5–15)
BUN: 21 mg/dL (ref 8–23)
CO2: 24 mmol/L (ref 22–32)
Calcium: 9.4 mg/dL (ref 8.9–10.3)
Chloride: 97 mmol/L — ABNORMAL LOW (ref 98–111)
Creatinine, Ser: 0.96 mg/dL (ref 0.44–1.00)
GFR, Estimated: 59 mL/min — ABNORMAL LOW (ref >60)
Glucose, Bld: 105 mg/dL — ABNORMAL HIGH (ref 70–99)
Potassium: 5.3 mmol/L — ABNORMAL HIGH (ref 3.5–5.1)
Sodium: 133 mmol/L — ABNORMAL LOW (ref 135–145)
Total Bilirubin: 1.1 mg/dL (ref 0.3–1.2)
Total Protein: 6.3 g/dL — ABNORMAL LOW (ref 6.5–8.1)

## 2021-08-09 LAB — CBC WITH DIFFERENTIAL/PLATELET
Abs Immature Granulocytes: 0.03 10*3/uL (ref 0.00–0.07)
Basophils Absolute: 0 10*3/uL (ref 0.0–0.1)
Basophils Relative: 0 %
Eosinophils Absolute: 0.1 10*3/uL (ref 0.0–0.5)
Eosinophils Relative: 1 %
HCT: 42.3 % (ref 36.0–46.0)
Hemoglobin: 13.5 g/dL (ref 12.0–15.0)
Immature Granulocytes: 0 %
Lymphocytes Relative: 16 %
Lymphs Abs: 1.2 10*3/uL (ref 0.7–4.0)
MCH: 28.2 pg (ref 26.0–34.0)
MCHC: 31.9 g/dL (ref 30.0–36.0)
MCV: 88.3 fL (ref 80.0–100.0)
Monocytes Absolute: 1.4 10*3/uL — ABNORMAL HIGH (ref 0.1–1.0)
Monocytes Relative: 18 %
Neutro Abs: 4.8 10*3/uL (ref 1.7–7.7)
Neutrophils Relative %: 65 %
Platelets: 227 10*3/uL (ref 150–400)
RBC: 4.79 MIL/uL (ref 3.87–5.11)
RDW: 13.6 % (ref 11.5–15.5)
WBC: 7.5 10*3/uL (ref 4.0–10.5)
nRBC: 0 % (ref 0.0–0.2)

## 2021-08-09 LAB — TROPONIN I (HIGH SENSITIVITY)
Troponin I (High Sensitivity): 6 ng/L (ref <18)
Troponin I (High Sensitivity): 5 ng/L (ref <18)

## 2021-08-09 LAB — URINALYSIS, COMPLETE (UACMP) WITH MICROSCOPIC
Bacteria, UA: NONE SEEN
Bilirubin Urine: NEGATIVE
Glucose, UA: NEGATIVE mg/dL
Hgb urine dipstick: NEGATIVE
Ketones, ur: NEGATIVE mg/dL
Nitrite: NEGATIVE
Protein, ur: NEGATIVE mg/dL
RBC / HPF: NONE SEEN RBC/hpf (ref 0–5)
Specific Gravity, Urine: <1.005 — ABNORMAL LOW (ref 1.005–1.030)
pH: 6.5 (ref 5.0–8.0)

## 2021-08-09 LAB — ETHANOL: Alcohol, Ethyl (B): <10 mg/dL (ref <10)

## 2021-08-09 LAB — RESP PANEL BY RT-PCR (FLU A&B, COVID) ARPGX2
Influenza A by PCR: NEGATIVE
Influenza B by PCR: NEGATIVE
SARS Coronavirus 2 by RT PCR: NEGATIVE

## 2021-08-09 LAB — VITAMIN B12: Vitamin B-12: 461 pg/mL (ref 180–914)

## 2021-08-09 LAB — BRAIN NATRIURETIC PEPTIDE: B Natriuretic Peptide: 24.5 pg/mL (ref 0.0–100.0)

## 2021-08-09 LAB — AMMONIA: Ammonia: 23 umol/L (ref 9–35)

## 2021-08-09 LAB — CBG MONITORING, ED: Glucose-Capillary: 105 mg/dL — ABNORMAL HIGH (ref 70–99)

## 2021-08-09 LAB — LACTIC ACID, PLASMA
Lactic Acid, Venous: 2.5 mmol/L (ref 0.5–1.9)
Lactic Acid, Venous: 1.9 mmol/L (ref 0.5–1.9)

## 2021-08-09 LAB — TSH: TSH: 1.901 u[IU]/mL (ref 0.350–4.500)

## 2021-08-09 MED ORDER — SODIUM CHLORIDE 0.9 % IV SOLN
2.0000 g | Freq: Two times a day (BID) | INTRAVENOUS | Status: DC
  Administered 2021-08-10 – 2021-08-11 (×3): 2 g via INTRAVENOUS
  Filled 2021-08-09 (×3): qty 2

## 2021-08-09 MED ORDER — VANCOMYCIN HCL 1750 MG/350ML IV SOLN: 1750.0000 mg | INTRAVENOUS | Status: DC

## 2021-08-09 MED ORDER — ACETAMINOPHEN 325 MG PO TABS: 650.0000 mg | ORAL_TABLET | Freq: Four times a day (QID) | ORAL | Status: DC | PRN

## 2021-08-09 MED ORDER — DIVALPROEX SODIUM 250 MG PO DR TAB
250.0000 mg | DELAYED_RELEASE_TABLET | Freq: Two times a day (BID) | ORAL | Status: DC
  Administered 2021-08-09 – 2021-08-11 (×4): 250 mg via ORAL
  Filled 2021-08-09 (×5): qty 1

## 2021-08-09 MED ORDER — ENOXAPARIN SODIUM 40 MG/0.4ML IJ SOSY
40.0000 mg | PREFILLED_SYRINGE | INTRAMUSCULAR | Status: DC
  Administered 2021-08-09 – 2021-08-10 (×2): 40 mg via SUBCUTANEOUS
  Filled 2021-08-09 (×2): qty 0.4

## 2021-08-09 MED ORDER — ACETAMINOPHEN 650 MG RE SUPP: 650.0000 mg | Freq: Four times a day (QID) | RECTAL | Status: DC | PRN

## 2021-08-09 MED ORDER — METRONIDAZOLE 500 MG/100ML IV SOLN
500.0000 mg | Freq: Once | INTRAVENOUS | Status: AC
  Administered 2021-08-09: 500 mg via INTRAVENOUS
  Filled 2021-08-09: qty 100

## 2021-08-09 MED ORDER — CEFEPIME HCL 2 G IJ SOLR
2.0000 g | Freq: Once | INTRAMUSCULAR | Status: AC
  Administered 2021-08-09: 2 g via INTRAVENOUS
  Filled 2021-08-09: qty 2

## 2021-08-09 MED ORDER — IOHEXOL 350 MG/ML SOLN
100.0000 mL | Freq: Once | INTRAVENOUS | Status: AC | PRN
  Administered 2021-08-09: 100 mL via INTRAVENOUS

## 2021-08-09 MED ORDER — LACTATED RINGERS IV BOLUS
30.0000 mL/kg | Freq: Once | INTRAVENOUS | Status: AC
  Administered 2021-08-09: 2082 mL via INTRAVENOUS

## 2021-08-09 MED ORDER — VANCOMYCIN HCL 1250 MG/250ML IV SOLN
1250.0000 mg | Freq: Once | INTRAVENOUS | Status: AC
  Administered 2021-08-09: 1250 mg via INTRAVENOUS
  Filled 2021-08-09: qty 250

## 2021-08-09 NOTE — Progress Notes (Signed)
Pharmacy Antibiotic Note  Megan Flores is a 86 y.o. female admitted on 08/09/2021 with  sepsis of unknown source .  Pharmacy has been consulted for Cefepime and vancomycin dosing x 7 days.   WBC wnl, SCr wnl, LA wnl   Plan: -Cefepime 2 gm IV Q 12 hours -Vancomycin 1250 mg IV load followed by Vancomycin 1750 mg IV Q 48 hrs. Goal AUC 400-550. Expected AUC: 486 SCr used: 0.96 -Monitor CBC, renal fx, cultures and clinical progress -Vanc levels as indicated    Height: '5\' 4"'$  (162.6 cm) Weight: 69.4 kg (153 lb) IBW/kg (Calculated) : 54.7  Temp (24hrs), Avg:95.2 F (35.1 C), Min:95.2 F (35.1 C), Max:95.2 F (35.1 C)  Recent Labs  Lab 08/07/21 2154  WBC 8.9  CREATININE 0.76    Estimated Creatinine Clearance: 51 mL/min (by C-G formula based on SCr of 0.76 mg/dL).    Allergies  Allergen Reactions   Levofloxacin Other (See Comments)    Insomnia leading to mania (ammoxicillin has always worked well for her) Insomnia leading to mania (ammoxicillin has always worked well for her)    Primidone Other (See Comments)    dizziness   Propranolol Anaphylaxis    hypotension   Neomycin-Bacitracin-Polymyxin [Bacitracin-Neomycin-Polymyxin] Rash   Neosporin [Neomycin-Bacitracin Zn-Polymyx] Rash   Polysporin [Bacitracin-Polymyxin B] Rash   Aspirin Other (See Comments)    ULCER ULCER    Topamax [Topiramate] Other (See Comments)    Antimicrobials this admission: Cefepime 9/17 >>  Vancomycin 9/17 >>   Dose adjustments this admission:  Microbiology results: 9/17 BCx:  9/17 UCx:     Thank you for allowing pharmacy to be a part of this patient's care.  Albertina Parr, PharmD., BCPS, BCCCP Clinical Pharmacist Please refer to [facility] for unit-specific pharmacist

## 2021-08-09 NOTE — ED Notes (Signed)
Daughter Megan Flores [phone] would like an update

## 2021-08-09 NOTE — ED Triage Notes (Signed)
From [facility]. Pt was seen here 2 days ago for multiple falls. Pt sent by facility today for confusion and lethargy. GCS 15. Pt falls asleep when answering questions. CBG 126. Klonopin on home med list.

## 2021-08-09 NOTE — ED Provider Notes (Addendum)
Care assumed from Stuart at shift change, please see his note for full details, but in brief Megan Flores is a 86 y.o. female from [place] accompanied by her husband for evaluation of altered mental status, husband reports she has not been acting like herself, and has not been eating and drinking today.  Patient found to be hypothermic on arrival with soft blood pressures, code sepsis initiated with broad-spectrum antibiotics and IV fluids.  Infectious work-up pending as well as head CT.  Anticipate admission.  BP 138/80   Pulse 80   Temp (!) 95.5 F (35.3 C) (Rectal)   Resp 17   Ht '5\' 4"'$  (1.626 m)   Wt 69.4 kg   SpO2 98%   BMI 26.26 kg/m    ED Course/Procedures   Labs Reviewed  COMPREHENSIVE METABOLIC PANEL - Abnormal; Notable for the following components:      Result Value   Sodium 133 (*)    Potassium 5.3 (*)    Chloride 97 (*)    Glucose, Bld 105 (*)    Total Protein 6.3 (*)    AST 42 (*)    GFR, Estimated 59 (*)    All other components within normal limits  CBC WITH DIFFERENTIAL/PLATELET - Abnormal; Notable for the following components:   Monocytes Absolute 1.4 (*)    All other components within normal limits  URINALYSIS, COMPLETE (UACMP) WITH MICROSCOPIC - Abnormal; Notable for the following components:   Specific Gravity, Urine <1.005 (*)    Leukocytes,Ua SMALL (*)    All other components within normal limits  LACTIC ACID, PLASMA - Abnormal; Notable for the following components:   Lactic Acid, Venous 2.5 (*)    All other components within normal limits  CBG MONITORING, ED - Abnormal; Notable for the following components:   Glucose-Capillary 105 (*)    All other components within normal limits  RESP PANEL BY RT-PCR (FLU A&B, COVID) [id number]  URINE CULTURE  CULTURE, BLOOD (ROUTINE X 2)  CULTURE, BLOOD (ROUTINE X 2)  ETHANOL  AMMONIA  BRAIN NATRIURETIC PEPTIDE  LACTIC ACID, PLASMA  RAPID URINE DRUG SCREEN, HOSP PERFORMED   TROPONIN I (HIGH SENSITIVITY)  TROPONIN I (HIGH SENSITIVITY)    DG Chest 1 View  Result Date: 08/09/2021 CLINICAL DATA:  Chest pain EXAM: CHEST  1 VIEW COMPARISON:  Chest radiograph 11/12/2008 FINDINGS: The heart is not enlarged. The mediastinal contours are within normal limits. The pulmonary vasculature is somewhat prominent. There is no focal consolidation or overt pulmonary edema. There is no pleural effusion or pneumothorax. There is no acute osseous abnormality. IMPRESSION: No radiographic evidence of acute cardiopulmonary process. Electronically Signed   By: Valetta Mole M.D.   On: 08/09/2021 15:17   CT HEAD WO CONTRAST  Result Date: 08/09/2021 CLINICAL DATA:  Altered mental status.  Multiple falls. EXAM: CT HEAD WITHOUT CONTRAST TECHNIQUE: Contiguous axial images were obtained from the base of the skull through the vertex without intravenous contrast. COMPARISON:  CT head 08/07/2021, CT head 07/30/2021, CT head 06/23/2013. BRAIN: BRAIN Brain: Cerebral ventricle sizes are concordant with the degree of cerebral volume loss. Patchy and confluent areas of decreased attenuation are noted throughout the deep and periventricular white matter of the cerebral hemispheres bilaterally, compatible with chronic microvascular ischemic disease. Incidentally noted asymmetric choroid plexus on the right compared to the left (6:26). No evidence of large-territorial acute infarction. Stable 1.6cm left infratentorial mass (5:64). No new mass lesion. No extra-axial collection. Mild mass effect from known  mass onto the left cerebellar hemisphere inferiorly no midline shift. No hydrocephalus. Basilar cisterns are patent. Vascular: No hyperdense vessel. Atherosclerotic calcifications are present within the cavernous internal carotid and vertebral arteries. Skull: No acute fracture or focal lesion. Sinuses/Orbits: Paranasal sinuses and mastoid air cells are clear. Bilateral lens replacement otherwise the orbits are  unremarkable. Other: None. IMPRESSION: No acute intracranial abnormality in a patient with a stable 1.6cm left infratentorial mass likely representing a meningioma. These results were called by telephone at the time of interpretation on 08/09/2021 at 4:12 pm to provider Pattricia Boss MD, who verbally acknowledged these results. Electronically Signed   By: Iven Finn M.D.   On: 08/09/2021 16:12   CT ABDOMEN PELVIS W CONTRAST  Result Date: 08/09/2021 CLINICAL DATA:  Abdominal pain. EXAM: CT ABDOMEN AND PELVIS WITH CONTRAST TECHNIQUE: Multidetector CT imaging of the abdomen and pelvis was performed using the standard protocol following bolus administration of intravenous contrast. CONTRAST:  130m OMNIPAQUE IOHEXOL 350 MG/ML SOLN COMPARISON:  None. FINDINGS: Lower chest: No acute abnormality. Hepatobiliary: No focal liver abnormality is seen. No gallstones, gallbladder wall thickening, or biliary dilatation. Pancreas: Unremarkable. No pancreatic ductal dilatation or surrounding inflammatory changes. Spleen: Normal in size without focal abnormality. Adrenals/Urinary Tract: There is a 16 mm cyst in the right kidney. There is a hypodensity in the left kidney which is too small to characterize. Kidneys otherwise appear within normal limits. There is no hydronephrosis or perinephric fluid. The bladder is distended and within normal limits. Stomach/Bowel: Stomach is within normal limits. Appendix appears normal. No evidence of bowel wall thickening, distention, or inflammatory changes. There is sigmoid colon diverticulosis without evidence for acute diverticulitis. Vascular/Lymphatic: Aortic atherosclerosis. No enlarged abdominal or pelvic lymph nodes. Reproductive: Status post hysterectomy. No adnexal masses. Other: No abdominal wall hernia or abnormality. No abdominopelvic ascites. Musculoskeletal: The bones are osteopenic. There is no acute fracture or dislocation. L1 vertebral body hemangioma is noted. IMPRESSION:  1. No acute localizing process in the abdomen or pelvis. 2. Sigmoid colon diverticulosis without evidence for diverticulitis. 3.  Aortic Atherosclerosis (ICD10-I70.0). Electronically Signed   By: ARonney AstersM.D.   On: 08/09/2021 18:27      Procedures  MDM   Work-up significant for a lactic of 2.5, this is improved with IV fluids.  Lab work otherwise without significant abnormality, no leukocytosis and no electrolyte derangements, chest x-ray without pneumonia or other acute cardiopulmonary findings.  Urinalysis without signs of infection.  CT with meningioma which is known and unchanged.  On reexamination of the patient she does endorse some generalized abdominal tenderness, given there is no clear source for infection at this time will get CT abdomen pelvis.  CT abdomen pelvis without acute abnormalities.  Given patient's altered mental status and hypothermia patient will require hospital admission for further evaluation.  Blood cultures are pending for additional infectious source.  Case discussed with internal medicine teaching who will see and admit the patient.         FJacqlyn Larsen PA-C 08/09/21 [place] DO 08/10/21 1519

## 2021-08-09 NOTE — H&P (Addendum)
Date: 08/10/2021               Patient Name:  Megan Flores MRN: [id number]  DOB: 1935-04-01 Age / Sex: 86 y.o., female   PCP: Hortencia Pilar, MD         Medical Service: Internal Medicine Teaching Service         Attending Physician: Dr. Aldine Contes, MD    First Contact: Wayland Denis, MD Pager: [phone]  Second Contact: Rick Duff, MD Pager: [phone]       After Hours (After 5p/  First Contact Pager: [phone]  weekends / holidays): Second Contact Pager: [phone]   SUBJECTIVE   Chief Complaint: Altered Mental Status   History of Present Illness: Megan Flores is a 86 year old female living with bipolar type I, HTN, HLD, idiopathic peripheral neuropathy, and essential tremor who presented from [place] facility for evaluation of altered mental status. I was unable to reach someone at [place] for more information, but did speak to patients husband. Patient was not altered on my examination, but can not give detailed medical history so some information is obtained through chart review and speaking to family. She denies being altered, but has frequent urination and says her peripheral neuropathy has been bothering her. She has come to the ED multiple times recently due to falls and says she has not felt like getting out of bed. She compares this to having UTI and reports having frequent urination in the past.Patient's speech is tangential and this is her baseline compared to her PCP notes. Patients husband, Megan Flores, reports patient had recent falls and he believes this could be due to her peripheral neuropathy. He mentions his wife became upset with him today when giving medication reconciliation to pharmacy technician. He reported she was on donepezil for early dementia, but patient did not like him saying she had dementia. Daughter reports mother was ambulating without assistance and doing well six months ago. Progressively she has had  difficult ambulating and has needed a cane. She was living at home but with recent decline she is living at assisted living facility.There was recent findings on CT Head of a intraventricular mass within the atrium of right later ventricle. Also a known meningioma, and last scan in 2021. PCP made shared decision to stop following this with repeat imaging. It was noted patient would not want surgery. Patient's PCP aware of new intraventricular mass and referred her to outpatient neurology.   Review of Systems  Constitutional:  Positive for chills. Negative for fever.  HENT:  Negative for congestion and ear pain.   Eyes:  Negative for blurred vision (has glasses) and double vision.  Respiratory:  Negative for cough and sputum production.   Cardiovascular:  Negative for chest pain and palpitations.  Gastrointestinal:  Positive for abdominal pain (lower abdomen). Negative for nausea.  Genitourinary:  Positive for urgency.  Musculoskeletal:  Positive for falls. Negative for myalgias.  Skin:  Negative for itching and rash.    Meds:  Current Meds  Medication Sig   Calcium Citrate-Vitamin D (CITRACAL + D PO) Take 1 tablet by mouth 2 (two) times daily.   clonazePAM (KLONOPIN) 0.5 MG disintegrating tablet Take 0.25 mg by mouth daily as needed (anxiety/agitation).   clonazePAM (KLONOPIN) 0.5 MG tablet Take 0.5 mg by mouth 3 (three) times daily.   divalproex (DEPAKOTE) 125 MG DR tablet Take 125 mg by mouth daily in the afternoon.   divalproex (DEPAKOTE) 250 MG DR  tablet Take 250 mg by mouth in the morning and at bedtime.   donepezil (ARICEPT) 5 MG tablet Take 5 mg by mouth at bedtime.   hydrochlorothiazide (HYDRODIURIL) 25 MG tablet Take 25 mg by mouth daily.   losartan (COZAAR) 25 MG tablet Take 25 mg by mouth daily.   Melatonin 10 MG TABS Take 10 mg by mouth at bedtime.   simvastatin (ZOCOR) 20 MG tablet Take 20 mg by mouth at bedtime.    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  PCP: Hortencia Pilar, MD Substances: No alcohol use, No Drug use, No history of tobacco use.   Family History:  Family History  Problem Relation Age of Onset   Cancer Mother    Breast cancer Mother 28   Heart failure Father    Ovarian cancer Sister 61   Alzheimer's disease Brother      Allergies: Allergies as of 08/09/2021 - Review Complete 08/09/2021  Allergen Reaction Noted   Aspirin Other (See Comments) 05/25/2013   Levofloxacin Other (See Comments) 03/06/2014   Primidone Other (See Comments) 06/14/2013   Propranolol Anaphylaxis and Other (See Comments) 06/14/2013   Neomycin-bacitracin-polymyxin [bacitracin-neomycin-polymyxin] Rash 11/29/2014   Neosporin [neomycin-bacitracin zn-polymyx] Rash 06/14/2013   Polysporin [bacitracin-polymyxin b] Rash 06/14/2013   Topamax [topiramate] Other (See Comments) 05/01/2015     OBJECTIVE:   Physical Exam: Blood pressure (!) 137/103, pulse 81, temperature (!) 95.6 F (35.3 C), temperature source Rectal, resp. rate 14, height '5\' 4"'$  (1.626 m), weight 69.4 kg, SpO2 98 %.  Constitutional: appears stated age, lying in hospital bed, in no acute distress HENT: normocephalic atraumatic, mucous membranes moist Eyes: conjunctiva non-erythematous, antiicteric sclerae  Neck: supple Cardiovascular: regular rate and rhythm, no m/r/g Pulmonary/Chest: normal work of breathing on room air, lungs clear to auscultation bilaterally Abdominal:  soft, suprapubic tenderness on palpation, non-distended MSK: normal bulk and tone. Lower extremity edema to knee 2+ Neurological: alert & oriented x 4 (person, place, time ,situation)  antigravity bilateral upper and lower extremities. CN II-XII intact. Postural and action tremor in bilateral UE Skin: warm and dry Psych: tangential speech, normal mood  Labs: CBC    Component Value Date/Time   WBC 8.1 08/10/2021 0231   RBC 5.12 (H) 08/10/2021 0231   HGB 14.5 08/10/2021 0231   HCT 43.6 08/10/2021 0231   PLT 256 08/10/2021 0231   MCV 85.2 08/10/2021 0231   MCH 28.3 08/10/2021 0231   MCHC 33.3 08/10/2021 0231   RDW 13.6 08/10/2021 0231   LYMPHSABS 0.3 (L) 08/10/2021 0231   MONOABS 0.8 08/10/2021 0231   EOSABS 0.0 08/10/2021 0231   BASOSABS 0.0 08/10/2021 0231     CMP     Component Value Date/Time   NA 135 08/10/2021 0231   K 3.8 08/10/2021 0231   CL 98 08/10/2021 0231   CO2 26 08/10/2021  0231   GLUCOSE 143 (H) 08/10/2021 0231   BUN 13 08/10/2021 0231   CREATININE 0.85 08/10/2021 0231   CALCIUM 9.3 08/10/2021 0231   PROT 6.7 08/10/2021 0231   ALBUMIN 3.5 08/10/2021 0231   AST 24 08/10/2021 0231   ALT 15 08/10/2021 0231   ALKPHOS 53 08/10/2021 0231   BILITOT 0.8 08/10/2021 0231   GFRNONAA >60 08/10/2021 0231    Imaging: DG Chest 1 View  Result Date: 08/09/2021 CLINICAL DATA:  Chest pain EXAM: CHEST  1 VIEW COMPARISON:  Chest radiograph 11/12/2008 FINDINGS: The heart is not enlarged. The mediastinal contours are within normal limits. The pulmonary vasculature is somewhat prominent. There is no focal consolidation or overt pulmonary edema. There is no pleural effusion or pneumothorax. There is no acute osseous abnormality. IMPRESSION: No radiographic evidence of acute cardiopulmonary process. Electronically Signed   By: Valetta Mole M.D.   On: 08/09/2021 15:17   CT HEAD WO CONTRAST  Result Date: 08/09/2021 CLINICAL DATA:  Altered mental status.  Multiple falls. EXAM: CT  HEAD WITHOUT CONTRAST TECHNIQUE: Contiguous axial images were obtained from the base of the skull through the vertex without intravenous contrast. COMPARISON:  CT head 08/07/2021, CT head 07/30/2021, CT head 06/23/2013. BRAIN: BRAIN Brain: Cerebral ventricle sizes are concordant with the degree of cerebral volume loss. Patchy and confluent areas of decreased attenuation are noted throughout the deep and periventricular white matter of the cerebral hemispheres bilaterally, compatible with chronic microvascular ischemic disease. Incidentally noted asymmetric choroid plexus on the right compared to the left (6:26). No evidence of large-territorial acute infarction. Stable 1.6cm left infratentorial mass (5:64). No new mass lesion. No extra-axial collection. Mild mass effect from known mass onto the left cerebellar hemisphere inferiorly no midline shift. No hydrocephalus. Basilar cisterns are patent. Vascular: No hyperdense vessel. Atherosclerotic calcifications are present within the cavernous internal carotid and vertebral arteries. Skull: No acute fracture or focal lesion. Sinuses/Orbits: Paranasal sinuses and mastoid air cells are clear. Bilateral lens replacement otherwise the orbits are unremarkable. Other: None. IMPRESSION: No acute intracranial abnormality in a patient with a stable 1.6cm left infratentorial mass likely representing a meningioma. These results were called by telephone at the time of interpretation on 08/09/2021 at 4:12 pm to provider Pattricia Boss MD, who verbally acknowledged these results. Electronically Signed   By: Iven Finn M.D.   On: 08/09/2021 16:12   CT ABDOMEN PELVIS W CONTRAST  Result Date: 08/09/2021 CLINICAL DATA:  Abdominal pain. EXAM: CT ABDOMEN AND PELVIS WITH CONTRAST TECHNIQUE: Multidetector CT imaging of the abdomen and pelvis was performed using the standard protocol following bolus administration of intravenous contrast. CONTRAST:  118m OMNIPAQUE IOHEXOL 350 MG/ML  SOLN COMPARISON:  None. FINDINGS: Lower chest: No acute abnormality. Hepatobiliary: No focal liver abnormality is seen. No gallstones, gallbladder wall thickening, or biliary dilatation. Pancreas: Unremarkable. No pancreatic ductal dilatation or surrounding inflammatory changes. Spleen: Normal in size without focal abnormality. Adrenals/Urinary Tract: There is a 16 mm cyst in the right kidney. There is a hypodensity in the left kidney which is too small to characterize. Kidneys otherwise appear within normal limits. There is no hydronephrosis or perinephric fluid. The bladder is distended and within normal limits. Stomach/Bowel: Stomach is within normal limits. Appendix appears normal. No evidence of bowel wall thickening, distention, or inflammatory changes. There is sigmoid colon diverticulosis without evidence for acute diverticulitis. Vascular/Lymphatic: Aortic atherosclerosis. No enlarged abdominal or pelvic lymph nodes. Reproductive: Status post hysterectomy. No adnexal masses. Other: No abdominal wall hernia  or abnormality. No abdominopelvic ascites. Musculoskeletal: The bones are osteopenic. There is no acute fracture or dislocation. L1 vertebral body hemangioma is noted. IMPRESSION: 1. No acute localizing process in the abdomen or pelvis. 2. Sigmoid colon diverticulosis without evidence for diverticulitis. 3.  Aortic Atherosclerosis (ICD10-I70.0). Electronically Signed   By: Ronney Asters M.D.   On: 08/09/2021 18:27     ASSESSMENT & PLAN:    Assessment & Plan by Problem: Active Problems:   Essential tremor   Bipolar 1 disorder (HCC)   Meningioma (HCC)   Altered mental status   YANICK BANDERAS is a 86 y.o. with pertinent PMH bipolar type I, HTN, HLD, idiopathic peripheral neuropathy, essential tremor, frequent falls, and intraventricular mass within the atrium of right later ventricle who presented with altered mental status.  #AMS # Recent Falls Patient is oriented on exam.  However  family is concerned with recent decline and multiple falls. She was seen by PCP 6 months ago for memory issues and evaluated with MOCA with deficits. She was started on Donepezil. PCP noted this came on with radiation for her SCC. She was referring patient to neurology , but do not see neurology notes or if patient is scheduled. In addition there is a new intraventricular mass within the atrium of right alteral ventricle, and referred to neurology to evaluate. Will discuss consulting inpatient Neurology with attending. Noted to have metal screw in tibia and this prevented MRI in the past.  -Patient with mild elevation in lactic acid , hypothermic/cold stress, and soft blood pressures so started on sepsis protocol. Will continue antibiotics while awaiting blood culture results. UA does not suggest urinary tract infection, but will obtain postvoid residual. Chest xray without sign of infection. WBC nl and no obvious source of infection at this time.  - Considered other etiologies like metabolic and medications , but no marked metabolic abnormalities.   Patient did have an increase in Depakote within the last 6 months. Will obtain Depakote level.  - Labs ordered - TSH, Depakote level, Repeat CMP, CBC - Continue Vanc and Cefepime , per pharmacy consult  #HTN - home medications HCTZ 25 mg daily, Losartan 25 mg daily - hold home antihypertensives , soft BP on admission and concern for sepsis  #HLD - Continue Simvastain 20 mg daily  #Hyponatremia #Hyperkalemia -K 5.3 and Na 133. Chronic hyponatremia. Will recheck labs. If patient hyperkalemic will obtain EKG and treat as indicated.   #Bipolar Type 1 Continue home Depakote. Multiple dosages appear in medication list. On review of PCP note, patient was on Depakote 250 mg daily, will continue. Checking depakote level. Patient also on Klonopin, will hold tonight given description of patient feeling sleepy. May need to stop this medication.   #Essential  Tremor - diagnosed in 2014. Appears she has failed treatment with Propanolol and Primidone. These medications are listed under allergies.    Diet: Normal VTE: Enoxaparin IVF: None,None Code: Full  Prior to Admission Living Arrangement: Assisted living facility  Anticipated Discharge Location: SNF Barriers to Discharge: Further evaluation  Dispo: Admit patient to Observation with expected length of stay less than 2 midnights.  Signed: Drema Pry, MD Internal Medicine Resident PGY-3 Pager: [phone]  08/10/2021, 4:56 AM

## 2021-08-09 NOTE — ED Notes (Signed)
Pt rectal temp still at 95.5 rectally. Bair hugger increased to high temp at this time. Will recheck temp again in an hour.

## 2021-08-09 NOTE — ED Provider Notes (Addendum)
[facility] EMERGENCY DEPARTMENT Provider Note   CSN: [id number] Arrival date & time: 08/09/21  1334     History Chief Complaint  Patient presents with   Altered Mental Status    Megan Flores is a 86 y.o. female with a history of asthma, bipolar disorder, hypertension, meningioma.  Patient was brought to emergency department from [facility] skilled nursing facility.  Per chart review RN note states nursing still that he reports patient was confused and lethargic.  Upon entering room patient is sleeping comfortably, easily aroused to voice.  Patient alert to person and place.  Patient's husband at bedside reports patient is normally alert to person, place, and time however he believes she has dementia.  When asked patient complains of chest pain.  Patient denies any other complaints.   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[place])    Essential tremor 06/14/2013   HTN (hypertension)    Hypercholesteremia    Osteoarthritis    Peripheral neuropathy    Tremor     Patient Active Problem List   Diagnosis Date Noted   Personal history of other malignant neoplasm of skin 10/23/2020   Fasting hyperglycemia 12/02/2016   Frequent falls 07/20/2016   Advanced directives, counseling/discussion 11/06/2015   Chronic hyponatremia 09/13/2014   Chronic insomnia 09/13/2014   Catatonia 03/03/2014   Delirium due to multiple etiologies, acute, hyperactive 02/26/2014   Essential tremor 06/14/2013   Reactive airway disease 05/25/2013   Bipolar 1 disorder ([place]) 05/25/2013   Meningioma ([place]) 05/25/2013   Osteoarthritis 05/25/2013   Peripheral neuropathy 05/25/2013   Pure hypercholesterolemia 05/25/2013    Past Surgical History:  Procedure Laterality Date   BREAST LUMPECTOMY Left 1998   pos    VAGINAL HYSTERECTOMY       OB History   No obstetric history on file.     Family History  Problem Relation Age of Onset   Cancer Mother    Breast cancer Mother 67   Heart failure Father    Ovarian cancer Sister 3    Social History   Tobacco Use   Smoking status: Never   Smokeless tobacco: Never  Substance Use Topics   Alcohol use: No   Drug use: No    Home Medications Prior to Admission medications   Medication Sig Start Date End Date Taking? Authorizing Provider  hydrochlorothiazide (HYDRODIURIL) 25 MG tablet Take 25 mg by mouth daily. 01/24/21  Yes [provider]  losartan (COZAAR) 25 MG tablet Take 25 mg by mouth daily. 12/18/20  Yes [provider]  Melatonin 10 MG TABS Take 10 mg by mouth at bedtime.   Yes [provider]  aspirin 81 MG tablet Take 81 mg by mouth daily.    [provider]  Calcium Citrate-Vitamin D (CITRACAL + D PO) Take 1 tablet by mouth 2 (two) times daily.    [provider]  clonazePAM (KLONOPIN) 0.5 MG tablet Take 0.5 mg by mouth daily as needed. 01/24/21   [provider]  divalproex (DEPAKOTE) 125 MG DR tablet Take 125 mg by mouth 2 (two) times daily.    [provider]  Flaxseed, Linseed, (FLAX SEED OIL) 1000 MG CAPS Take 1 capsule by mouth 2 (two) times daily.  [provider]  lisinopril (PRINIVIL,ZESTRIL) 5 MG tablet Take 5 mg by mouth daily.    [provider]  meclizine (ANTIVERT) 25 MG tablet Take 25 mg by mouth 4 (four) times daily.    [provider]  QUEtiapine (SEROQUEL) 100 MG tablet Take 100 mg by mouth at bedtime. 12/16/20   [provider]  topiramate (TOPAMAX) 50 MG tablet 1/2 pill each bedtime x 1 week, then 1 pill nightly x 1 week, then 1-1/2 pills nightly x 1 week, then 2 pills nightly thereafter. Patient not taking: No sig reported 06/14/13   Star Age, MD  vitamin B-12 (CYANOCOBALAMIN) 500 MCG tablet Take 500 mcg by mouth daily.     [provider]    Allergies    Levofloxacin, Primidone, Propranolol, Neomycin-bacitracin-polymyxin [bacitracin-neomycin-polymyxin], Neosporin [neomycin-bacitracin zn-polymyx], Polysporin [bacitracin-polymyxin b], Aspirin, and Topamax [topiramate]  Review of Systems   Review of Systems  Unable to perform ROS: Mental status change   Physical Exam Updated Vital Signs BP (!) 111/97 (BP Location: Right Arm)   Pulse 77   Temp (!) 95.2 F (35.1 C) (Rectal)   Resp 15   Ht '5\' 4"'$  (1.626 m)   Wt 69.4 kg   SpO2 100%   BMI 26.26 kg/m   Physical Exam Vitals and nursing note reviewed.  Constitutional:      General: She is sleeping. She is not in acute distress.    Appearance: She is not ill-appearing, toxic-appearing or diaphoretic.     Comments: Patient is somnolent, easily aroused.  Patient dozes to sleep during interview.  HENT:     Head: Normocephalic and atraumatic.  Eyes:     General: No scleral icterus.       Right eye: No discharge.        Left eye: No discharge.     Extraocular Movements: Extraocular movements intact.     Pupils: Pupils are equal, round, and reactive to light.  Cardiovascular:     Rate and Rhythm: Normal rate.     Pulses:          Radial pulses are 2+ on the right side and 2+ on the left side.  Pulmonary:     Effort: Pulmonary effort is normal. No tachypnea, bradypnea, accessory muscle usage or respiratory distress.     Breath sounds: Normal breath sounds.  Abdominal:     General: There is no distension. There are no signs of injury.     Palpations: Abdomen is soft. There is no mass or pulsatile mass.     Tenderness: There is no abdominal tenderness. There is no guarding or rebound.     Hernia: There is no hernia in the umbilical area or ventral area.  Musculoskeletal:     Cervical back: Normal range of motion and neck supple. No edema, erythema, signs of trauma, rigidity, torticollis or crepitus. No pain with movement, spinous process  tenderness or muscular tenderness. Normal range of motion.     Right lower leg: 2+ Edema present.     Left lower leg: 2+ Edema present.     Comments: No midline tenderness or deformity to cervical, thoracic, or lumbar spine.  No tenderness, bony tenderness, or deformity to bilateral upper or lower extremities.  Pelvis stable.  Skin:    General: Skin is warm and dry.  Neurological:     General: No focal deficit present.     Mental Status: She is easily aroused.     GCS: GCS eye subscore is 3. GCS  verbal subscore is 4. GCS motor subscore is 6.     Cranial Nerves: No cranial nerve deficit or facial asymmetry.     Motor: No weakness, tremor or seizure activity.     Comments: CN II-XII intact, equal grip strength, patient moves all limbs equally without difficulty.  Patient alert to person and place.  Patient was seen and  Psychiatric:        Behavior: Behavior is cooperative.    ED Results / Procedures / Treatments   Labs (all labs ordered are listed, but only abnormal results are displayed) Labs Reviewed - No data to display  EKG None  Radiology CT HEAD WO CONTRAST (5MM)  Result Date: 08/07/2021 CLINICAL DATA:  Facial trauma. patient with multiple falls this week. This is the third fall this week. Patient having bilateral hip pain and having headache, did hit head. EXAM: CT HEAD WITHOUT CONTRAST TECHNIQUE: Contiguous axial images were obtained from the base of the skull through the vertex without intravenous contrast. COMPARISON:  CT head 07/30/2021, CT head 06/23/2013 FINDINGS: Brain: Cerebral ventricle sizes are concordant with the degree of cerebral volume loss. Patchy and confluent areas of decreased attenuation are noted throughout the deep and periventricular white matter of the cerebral hemispheres bilaterally, compatible with chronic microvascular ischemic disease. No evidence of large-territorial acute infarction. Stable 1.6cm left infratentorial mass. No new mass lesion. No  extra-axial collection. Mild mass effect from known mass onto the left cerebellar hemisphere inferiorly no midline shift. No hydrocephalus. Basilar cisterns are patent. Vascular: No hyperdense vessel. Atherosclerotic calcifications are present within the cavernous internal carotid and vertebral arteries. Skull: No acute fracture or focal lesion. Sinuses/Orbits: Paranasal sinuses and mastoid air cells are clear. Bilateral lens replacement otherwise the orbits are unremarkable. Other: None. IMPRESSION: No acute intracranial abnormality in a patient with a stable 1.6cm left infratentorial mass likely representing a meningioma. Electronically Signed   By: Iven Finn M.D.   On: 08/07/2021 23:25   DG Hips Bilat W or Wo Pelvis 3-4 Views  Result Date: 08/07/2021 CLINICAL DATA:  Fall EXAM: DG HIP (WITH OR WITHOUT PELVIS) 3-4V BILAT COMPARISON:  None. FINDINGS: SI joints are non widened. Pubic symphysis and rami appear intact. No fracture or malalignment. Vascular calcifications. IMPRESSION: No acute osseous abnormality Electronically Signed   By: Donavan Foil M.D.   On: 08/07/2021 23:48    Procedures .Critical Care Performed by: Loni Beckwith, PA-C Authorized by: Loni Beckwith, PA-C   Critical care provider statement:    Critical care time (minutes):  45   Critical care was necessary to treat or prevent imminent or life-threatening deterioration of the following conditions:  Sepsis   Critical care was time spent personally by me on the following activities:  Evaluation of patient's response to treatment, examination of patient, ordering and performing treatments and interventions, ordering and review of laboratory studies, ordering and review of radiographic studies, pulse oximetry, re-evaluation of patient's condition, obtaining history from patient or surrogate and review of old charts   Medications Ordered in ED Medications - No data to display  ED Course  I have reviewed the triage  vital signs and the nursing notes.  Pertinent labs & imaging results that were available during my care of the patient were reviewed by me and considered in my medical decision making (see chart for details).  Clinical Course as of 08/09/21 1809  Sat Aug 09, 2021  1612 CT HEAD WO CONTRAST [DR]  1612 CT HEAD WO CONTRAST [DR]  103 CT HEAD WO CONTRAST [DR]    Clinical Course User Index [DR] Pattricia Boss, MD   MDM Rules/Calculators/A&P                           Sleeping 86 year old female in no acute distress, nontoxic appearing.  Patient is easily aroused to voice.  Patient falls asleep during interview.  GCS 13.  Patient's husband at bedside reports patient is normally alert to person, place, and time however states he think she has dementia.  Attempted to contact [facility] skilled [facility] facility, unable to reach them at this time, message was placed for call back.  Per chart review patient was sent to emergency department on 9/15 after suffering multiple falls.  Patient reported pain to hip and head.  Patient appears to have left before seeing a provider.  Noncontrast head CT showed no acute intracranial abnormality.  Bilateral hip x-ray showed no acute osseous abnormality.  BMP and  Due to patient's hypothermia concern for sepsis.  We will give patient 30 mL/kg fluid bolus and start her on broad-spectrum antibiotics.  Patient has no focal neurological deficits, low suspicion for CVA at this time.  CBC unremarkable Urinalysis shows no sign of infection Ethanol within normal limits CBG 105 Ammonia within normal limits Lactic acid elevated at 2.5  Chest x-ray shows no active cardiopulmonary disease CT head shows no acute intracranial abnormality.  Patient care and treatment were discussed with attending physician Dr. Jeanell Sparrow.  Patient care transferred to [place] at the end of my shift. Patient presentation, ED course, and plan of care discussed with review of all pertinent labs  and imaging. Please see his/her note for further details regarding further ED course and disposition.   Final Clinical Impression(s) / ED Diagnoses Final diagnoses:  None    Rx / DC Orders ED Discharge Orders     None        Loni Beckwith, PA-C 08/09/21 1637    Loni Beckwith, PA-C 08/09/21 1811    Pattricia Boss, MD 08/11/21 1436

## 2021-08-09 NOTE — ED Notes (Signed)
Patient transported to CT 

## 2021-08-09 NOTE — ED Notes (Signed)
Pt care taken, pt resting, no complaints at this time. 

## 2021-08-09 NOTE — ED Notes (Signed)
Attempted IV placement x 3 with no success. IV team consult placed.

## 2021-08-10 ENCOUNTER — Encounter (HOSPITAL_COMMUNITY): Payer: Self-pay | Admitting: Internal Medicine

## 2021-08-10 ENCOUNTER — Other Ambulatory Visit: Payer: Self-pay

## 2021-08-10 DIAGNOSIS — F319 Bipolar disorder, unspecified: Secondary | ICD-10-CM | POA: Diagnosis present

## 2021-08-10 DIAGNOSIS — G250 Essential tremor: Secondary | ICD-10-CM | POA: Diagnosis present

## 2021-08-10 DIAGNOSIS — W06XXXA Fall from bed, initial encounter: Secondary | ICD-10-CM | POA: Diagnosis present

## 2021-08-10 DIAGNOSIS — D329 Benign neoplasm of meninges, unspecified: Secondary | ICD-10-CM | POA: Diagnosis present

## 2021-08-10 DIAGNOSIS — Z803 Family history of malignant neoplasm of breast: Secondary | ICD-10-CM | POA: Diagnosis not present

## 2021-08-10 DIAGNOSIS — Z9181 History of falling: Secondary | ICD-10-CM | POA: Diagnosis not present

## 2021-08-10 DIAGNOSIS — R4182 Altered mental status, unspecified: Secondary | ICD-10-CM

## 2021-08-10 DIAGNOSIS — R404 Transient alteration of awareness: Secondary | ICD-10-CM | POA: Diagnosis not present

## 2021-08-10 DIAGNOSIS — Z820 Family history of epilepsy and other diseases of the nervous system: Secondary | ICD-10-CM | POA: Diagnosis not present

## 2021-08-10 DIAGNOSIS — R680 Hypothermia, not associated with low environmental temperature: Secondary | ICD-10-CM | POA: Diagnosis present

## 2021-08-10 DIAGNOSIS — F0390 Unspecified dementia without behavioral disturbance: Secondary | ICD-10-CM | POA: Diagnosis present

## 2021-08-10 DIAGNOSIS — E872 Acidosis: Secondary | ICD-10-CM | POA: Diagnosis present

## 2021-08-10 DIAGNOSIS — R251 Tremor, unspecified: Secondary | ICD-10-CM

## 2021-08-10 DIAGNOSIS — Z79899 Other long term (current) drug therapy: Secondary | ICD-10-CM | POA: Diagnosis not present

## 2021-08-10 DIAGNOSIS — Z8249 Family history of ischemic heart disease and other diseases of the circulatory system: Secondary | ICD-10-CM | POA: Diagnosis not present

## 2021-08-10 DIAGNOSIS — I10 Essential (primary) hypertension: Secondary | ICD-10-CM | POA: Diagnosis present

## 2021-08-10 DIAGNOSIS — M1990 Unspecified osteoarthritis, unspecified site: Secondary | ICD-10-CM | POA: Diagnosis present

## 2021-08-10 DIAGNOSIS — E875 Hyperkalemia: Secondary | ICD-10-CM | POA: Diagnosis present

## 2021-08-10 DIAGNOSIS — Y92092 Bedroom in other non-institutional residence as the place of occurrence of the external cause: Secondary | ICD-10-CM | POA: Diagnosis not present

## 2021-08-10 DIAGNOSIS — Z90710 Acquired absence of both cervix and uterus: Secondary | ICD-10-CM | POA: Diagnosis not present

## 2021-08-10 DIAGNOSIS — G609 Hereditary and idiopathic neuropathy, unspecified: Secondary | ICD-10-CM | POA: Diagnosis present

## 2021-08-10 DIAGNOSIS — J45909 Unspecified asthma, uncomplicated: Secondary | ICD-10-CM | POA: Diagnosis present

## 2021-08-10 DIAGNOSIS — Z20822 Contact with and (suspected) exposure to covid-19: Secondary | ICD-10-CM | POA: Diagnosis present

## 2021-08-10 DIAGNOSIS — Z85828 Personal history of other malignant neoplasm of skin: Secondary | ICD-10-CM | POA: Diagnosis not present

## 2021-08-10 DIAGNOSIS — Z853 Personal history of malignant neoplasm of breast: Secondary | ICD-10-CM | POA: Diagnosis not present

## 2021-08-10 DIAGNOSIS — E871 Hypo-osmolality and hyponatremia: Secondary | ICD-10-CM | POA: Diagnosis present

## 2021-08-10 DIAGNOSIS — E7800 Pure hypercholesterolemia, unspecified: Secondary | ICD-10-CM | POA: Diagnosis present

## 2021-08-10 LAB — COMPREHENSIVE METABOLIC PANEL
ALT: 15 U/L (ref 0–44)
AST: 24 U/L (ref 15–41)
Albumin: 3.5 g/dL (ref 3.5–5.0)
Alkaline Phosphatase: 53 U/L (ref 38–126)
Anion gap: 11 (ref 5–15)
BUN: 13 mg/dL (ref 8–23)
CO2: 26 mmol/L (ref 22–32)
Calcium: 9.3 mg/dL (ref 8.9–10.3)
Chloride: 98 mmol/L (ref 98–111)
Creatinine, Ser: 0.85 mg/dL (ref 0.44–1.00)
GFR, Estimated: >60 mL/min (ref >60)
Glucose, Bld: 143 mg/dL — ABNORMAL HIGH (ref 70–99)
Potassium: 3.8 mmol/L (ref 3.5–5.1)
Sodium: 135 mmol/L (ref 135–145)
Total Bilirubin: 0.8 mg/dL (ref 0.3–1.2)
Total Protein: 6.7 g/dL (ref 6.5–8.1)

## 2021-08-10 LAB — CBC WITH DIFFERENTIAL/PLATELET
Abs Immature Granulocytes: 0.04 10*3/uL (ref 0.00–0.07)
Basophils Absolute: 0 10*3/uL (ref 0.0–0.1)
Basophils Relative: 0 %
Eosinophils Absolute: 0 10*3/uL (ref 0.0–0.5)
Eosinophils Relative: 0 %
HCT: 43.6 % (ref 36.0–46.0)
Hemoglobin: 14.5 g/dL (ref 12.0–15.0)
Immature Granulocytes: 1 %
Lymphocytes Relative: 4 %
Lymphs Abs: 0.3 10*3/uL — ABNORMAL LOW (ref 0.7–4.0)
MCH: 28.3 pg (ref 26.0–34.0)
MCHC: 33.3 g/dL (ref 30.0–36.0)
MCV: 85.2 fL (ref 80.0–100.0)
Monocytes Absolute: 0.8 10*3/uL (ref 0.1–1.0)
Monocytes Relative: 10 %
Neutro Abs: 6.9 10*3/uL (ref 1.7–7.7)
Neutrophils Relative %: 85 %
Platelets: 256 10*3/uL (ref 150–400)
RBC: 5.12 MIL/uL — ABNORMAL HIGH (ref 3.87–5.11)
RDW: 13.6 % (ref 11.5–15.5)
WBC: 8.1 10*3/uL (ref 4.0–10.5)
nRBC: 0 % (ref 0.0–0.2)

## 2021-08-10 LAB — FOLATE: Folate: 19.3 ng/mL (ref >5.9)

## 2021-08-10 LAB — MAGNESIUM: Magnesium: 1.7 mg/dL (ref 1.7–2.4)

## 2021-08-10 LAB — CBG MONITORING, ED: Glucose-Capillary: 136 mg/dL — ABNORMAL HIGH (ref 70–99)

## 2021-08-10 LAB — VITAMIN D 25 HYDROXY (VIT D DEFICIENCY, FRACTURES): Vit D, 25-Hydroxy: 35.67 ng/mL (ref 30–100)

## 2021-08-10 MED ORDER — SIMVASTATIN 20 MG PO TABS: 20.0000 mg | ORAL_TABLET | Freq: Every day | ORAL | Status: DC

## 2021-08-10 NOTE — Evaluation (Signed)
Occupational Therapy Evaluation Patient Details Name: Megan Flores MRN: [id number] DOB: 1935-08-23 Today's Date: 08/10/2021   History of Present Illness Pt adm 9/17 with AMS and fall from [facility]. PMH - falls, bipolar, HTN, peripheral neuropathy, essential tremor, breast CA, lt infratentorial mass likely representing a meningioma   Clinical Impression   Pt is typically mod I for ADL and uses SPC for mobility. IADL is done by ALF. Today Pt is very pleasant, cooperative, and performed transfers from low bed at mod A, improved from higher surfaces with rails/arm rests. Min A for in room mobility with Rollator. Max A to don socks (which she reports she typically does herself), min guard for sink level grooming. OT will continue to follow acutely as well as recommending post-acute OT at [facility] level at ALF- if Megan Flores is unable to provide might need to consider higher level of care. Next session focus on LB ADL, independence in transfers and activity tolerance for standing grooming.   Of note: Pt loves painting and looking at the clouds.       Recommendations for follow up therapy are one component of a multi-disciplinary discharge planning process, led by the attending physician.  Recommendations may be updated based on patient status, additional functional criteria and insurance authorization.   Follow Up Recommendations  Home health OT (at ALF)    Equipment Recommendations  Tub/shower seat    Recommendations for Other Services PT consult     Precautions / Restrictions Precautions Precautions: Fall Restrictions Weight Bearing Restrictions: No      Mobility Bed Mobility Overal bed mobility: Needs Assistance Bed Mobility: Supine to Sit;Sit to Supine     Supine to sit: Min assist;HOB elevated Sit to supine: Min assist;HOB elevated   General bed mobility comments: Assist to elevate trunk into sitting.use of bed pad to come to EOB    Transfers Overall transfer  level: Needs assistance Equipment used: 4-wheeled walker Transfers: Sit to/from Stand Sit to Stand: Mod assist         General transfer comment: Assist for balance and boost on rising to stand.    Balance Overall balance assessment: Needs assistance Sitting-balance support: No upper extremity supported;Feet supported Sitting balance-Leahy Scale: Fair     Standing balance support: Bilateral upper extremity supported Standing balance-Leahy Scale: Poor Standing balance comment: rollator and min guard A for static standing                           ADL either performed or assessed with clinical judgement   ADL Overall ADL's : Needs assistance/impaired Eating/Feeding: Set up;Sitting Eating/Feeding Details (indicate cue type and reason): set up by cutting up som food, putting utensils in sight, able to open juice containers Grooming: Min guard;Standing Grooming Details (indicate cue type and reason): sink level Upper Body Bathing: Min guard;Sitting   Lower Body Bathing: Minimal assistance;Sitting/lateral leans   Upper Body Dressing : Set up;Sitting Upper Body Dressing Details (indicate cue type and reason): to don gown like robe Lower Body Dressing: Maximal assistance;Sitting/lateral leans Lower Body Dressing Details (indicate cue type and reason): to dno socks this session, Pt attempted Toilet Transfer: Minimal assistance;Ambulation (Rollator)   Toileting- Water quality scientist and Hygiene: Sit to/from stand;Moderate assistance       Functional mobility during ADLs: Minimal assistance;Rolling walker       Vision Baseline Vision/History: 1 Wears glasses Ability to See in Adequate Light: 0 Adequate Patient Visual Report: No change from baseline  Perception     Praxis      Pertinent Vitals/Pain Pain Assessment: No/denies pain     Hand Dominance Right   Extremity/Trunk Assessment Upper Extremity Assessment Upper Extremity Assessment: Overall WFL  for tasks assessed   Lower Extremity Assessment Lower Extremity Assessment: Defer to PT evaluation   Cervical / Trunk Assessment Cervical / Trunk Assessment: Kyphotic   Communication Communication Communication: HOH   Cognition Arousal/Alertness: Awake/alert Behavior During Therapy: WFL for tasks assessed/performed Overall Cognitive Status: No family/caregiver present to determine baseline cognitive functioning Area of Impairment: Orientation;Attention;Memory;Following commands;Safety/judgement;Awareness;Problem solving                 Orientation Level: Disoriented to;Time Current Attention Level: Sustained Memory: Decreased short-term memory Following Commands: Follows one step commands with increased time Safety/Judgement: Decreased awareness of deficits;Decreased awareness of safety Awareness: Intellectual Problem Solving: Slow processing;Decreased initiation;Difficulty sequencing;Requires verbal cues;Requires tactile cues General Comments: Pt very pleasant and cooperative throughout session   General Comments  Pt loves to paint    Exercises     Shoulder Instructions      Home Living Family/patient expects to be discharged to:: Assisted living                             Home Equipment: Cane - single point   Additional Comments: Pt poor historian and info from chart in addition to pt      Prior Functioning/Environment Level of Independence: Needs assistance  Gait / Transfers Assistance Needed: Pt amb with recent use of cane and with history of frequent falls. Pt poor historian and info from chart in addition to pt ADL's / Homemaking Assistance Needed: eats in dining hall, typically does own ADL bathing/dressing            OT Problem List: Decreased activity tolerance;Impaired balance (sitting and/or standing);Decreased safety awareness;Decreased knowledge of use of DME or AE      OT Treatment/Interventions: Self-care/ADL training;Energy  conservation;DME and/or AE instruction;Therapeutic activities;Patient/family education;Balance training    OT Goals(Current goals can be found in the care plan section) Acute Rehab OT Goals Patient Stated Goal: look at the clouds OT Goal Formulation: With patient Time For Goal Achievement: 08/24/21 Potential to Achieve Goals: Good ADL Goals Pt Will Perform Grooming: with supervision;standing Pt Will Perform Upper Body Dressing: with modified independence;sitting Pt Will Perform Lower Body Dressing: with supervision;sit to/from stand Pt Will Transfer to Toilet: with supervision;ambulating Pt Will Perform Toileting - Clothing Manipulation and hygiene: sit to/from stand;with supervision  OT Frequency: Min 2X/week   Barriers to D/C:            Co-evaluation              AM-PAC OT "6 Clicks" Daily Activity     Outcome Measure Help from another person eating meals?: A Little Help from another person taking care of personal grooming?: A Little Help from another person toileting, which includes using toliet, bedpan, or urinal?: A Little Help from another person bathing (including washing, rinsing, drying)?: A Little Help from another person to put on and taking off regular upper body clothing?: A Little Help from another person to put on and taking off regular lower body clothing?: A Lot 6 Click Score: 17   End of Session Equipment Utilized During Treatment: Gait belt;Rolling walker Nurse Communication: Mobility status  Activity Tolerance: Patient tolerated treatment well Patient left: in chair;with call bell/phone within reach;with chair alarm set  OT  Visit Diagnosis: Other abnormalities of gait and mobility (R26.89);History of falling (Z91.81);Muscle weakness (generalized) (M62.81);Other symptoms and signs involving cognitive function                Time: [id number] OT Time Calculation (min): 33 min Charges:  OT General Charges $OT Visit: 1 Visit OT Evaluation $OT Eval  Moderate Complexity: 1 Mod OT Treatments $Self Care/Home Management : 8-22 mins Jesse Sans OTR/L Acute Rehabilitation Services Pager: [phone] Office: [place] 08/10/2021, 5:40 PM

## 2021-08-10 NOTE — Hospital Course (Signed)
Arranging flowers and fell. Mefhanical fall.   Lives at facility. [facility]   Fallen before lots of times.  Knows year, knows location,  Mydriasis, nystagmus,   No abd pain. No n/v, dysuria, sob, or cough.   Tremor in both arms starteed at same time. Has tremor in tongue   Postural tremor. Resting tremor though eedreasee amplitude.   Knee pain on palpation.    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   A polypoid intraventricular mass is seen within the atrium of the right lateral ventricle, new from prior examination, demonstrating speckled calcification possibly representing a a choroid plexus neoplasm, such as a choroid plexus papilloma, or ependymoma. This measures 15 x 20 by 19 mm. Mild ventriculomegaly is present, slightly progressive since prior examination, but commensurate with the degree of parenchymal atrophy and likely reflecting the sequela of central atrophy. No definite evidence of hydrocephalus.   Mild periventricular white matter changes are present likely reflecting the sequela of small vessel ischemia.

## 2021-08-10 NOTE — ED Notes (Signed)
Emptied 1034m out of pure wick

## 2021-08-10 NOTE — ED Notes (Signed)
Emptied 700 cc of urine from purewick, also cleaned patient from excess urine, bedsheets and chuck

## 2021-08-10 NOTE — ED Notes (Signed)
Pt feels better and said that she felt hot, turned off the bair hugger, oral temp 98.7

## 2021-08-10 NOTE — ED Notes (Signed)
Pt voided 400 and post residual was 113

## 2021-08-10 NOTE — Progress Notes (Signed)
Subjective: No acute events or concerns overnight since admission.   Patient was seen and examined on rounds in ED.  She reports feeling okay.  States she had a fall, was sitting on the edge of the bed rearranging flowers and had mechanical fall.  Oriented to self, place, year.  Stated it is November.  Reports frequent falls recently.  Endorses bilateral knee pain.  She denies fever, nausea, vomiting, diarrhea, sores on back or buttocks, cough.  Objective:  Vital signs in last 24 hours: Vitals:   08/10/21 1030 08/10/21 1100 08/10/21 1130 08/10/21 1242  BP: 139/68 (!) 145/71 140/68 134/70  Pulse: 98 98 96 97  Resp: 20 20 (!) 21 20  Temp:   98.5 F (36.9 C) 100.3 F (37.9 C)  TempSrc:   Oral Axillary  SpO2: 99% 98% 97% 98%  Weight:      Height:       Physical Exam: General: Well appearing elderly Caucasian female, NAD HENT: normocephalic, atraumatic EYES: conjunctiva non-erythematous, no scleral icterus CV: regular rate, normal rhythm, no murmurs, rubs, gallops. Pulmonary: sating well on RA, lung clear to auscultation, no rales, wheezes, rhonchi Abdominal: non-distended, soft, non-tender to palpation, normal BS Skin: Exposed skin warm and dry, no rashes or lesions Neurological: MS: awake, alert and oriented x3, normal speech and limited fund of knowledge Motor: moves all extremities independently Tremor exam: Patient has head tremor, bilateral upper extremity postural tremor, as well as action tremor.  Patient has right upper extremity resting tremor.   No rigidity in right upper extremity. Psych: normal affect   Assessment/Plan:  Active Problems:   Essential tremor   Bipolar 1 disorder (HCC)   Meningioma (HCC)   Altered mental status  Megan Flores is a 86 y.o. 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  #Recent Falls #Memory Loss #Concern for infection #Lactic acidosis, resolved Patient is oriented on exam.  Completed MoCA on 01/29/21 with a score of 20/30.  Has had recent decline and multiple falls since radiation treatment for her SCC. On donepezil.  Was referred to neurology but was never seen by neurology.  2 masses noted on recent CT imaging unlikely to be cause of the symptoms. CTH negative for intracranial bleed or other acute abnormalities. Patient with mild elevation in lactic acid , hypothermic, and soft blood pressures,started on ED sepsis protocol. UA does not suggest urinary tract infection. Chest xray without sign of infection.  COVID-negative.  Negative CT abdomen pelvis. WBC nl and no obvious source of infection at this time. Considered other etiologies like metabolic and medications, but no marked metabolic abnormalities and no new medications, Depakote level, TSH, B12, ammonia, ethanol all normal.  She takes Klonopin 0.5 mg twice daily scheduled which can contribute to fall risk.  May need to consider discontinuing. - Continue Vanc and Cefepime, per pharmacy consult until blood cultures develop -Monitor for signs of infection -Blood cultures NGTD, urine culture NGTD  #Intraventricular mass concerning for neoplasm #Meningioma, stable #History of SCC of scalp Per radiology, there is increase in size of intraventricular mass since imaging in 2014.  No increase in size from 9/7 to admission.  No obstruction on imaging.  Unlikely contributing to patient's current symptoms.  Patient will need referral to see neurology outpatient for further follow-up.  #HTN Patient takes the following home medications for  blood pressure: HCTZ 25 mg daily, Losartan 25 mg daily. Antihypertensives were held given soft BPs on admission and concern for sepsis.  Currently normotensive.  -Continue to hold antihypertensives, can increase if blood pressures start to  elevate  #HLD - Continue Simvastain 20 mg daily  #Hyponatremia #Hyperkalemia K 5.3 and Na 133 on arrival.  She has history of chronic hyponatremia.  On repeat labs, potassium 3.8 and sodium 135. -Monitor and replete/treat as necessary  #Essential Tremor Diagnosed in 2014. She has failed treatment with Propanolol and Primidone.  Patient is on Depakote which can be helpful in some patients with essential tremor. Patient also on Klonopin 0.5 mg 3 times daily, currently holding, which are sometimes used in the treatment of ET though effectiveness is limited. RUE resting tremor likely secondary to essential tremor, can be seen in patients with long history of ET. Lack of rigidity in right upper extremity supports this. Head tremor and bilateral presentation of action tremor support essential tremor low concern for Parkinson's.   #Bipolar Type 1 Continue home Depakote.  Depakote level normal on admission.  Patient also on Klonopin 0.5 mg 3 times daily, currently holding.   Diet: Regular  VTE: Lovenox IVF: None Code: Full   Prior to Admission Living Arrangement: [facility] Anticipated Discharge Location: [facility] Barriers to Discharge: Pending culture results Dispo: Anticipated discharge in approximately 1 day(s).   Wayland Denis, MD 08/10/2021, 12:56 PM Pager: [phone] After 5pm on weekdays and 1pm on weekends: On Call pager [phone]

## 2021-08-10 NOTE — Evaluation (Signed)
Physical Therapy Evaluation Patient Details Name: Megan Flores MRN: [id number] DOB: 1935/06/08 Today's Date: 08/10/2021  History of Present Illness  Pt adm 9/17 with AMS and fall from [facility]. PMH - falls, bipolar, HTN, peripheral neuropathy, essential tremor, breast CA, lt infratentorial mass likely representing a meningioma  Clinical Impression  Pt presents to PT with decr mobility and a history of falling. Per the chart pt has been using a cane. I had her use a rollator today and that gave her more stability. If pt's ALF can provide some initial assist with mobility on return, feel pt could return there with HHPT coming in to work with her. If ALF unable to provide any assistance then may need to look at other post acute options.      Recommendations for follow up therapy are one component of a multi-disciplinary discharge planning process, led by the attending physician.  Recommendations may be updated based on patient status, additional functional criteria and insurance authorization.  Follow Up Recommendations Home health PT (at ALF if they can provid some assistance for initial mobility)    Equipment Recommendations  Other (comment) (rollator)    Recommendations for Other Services       Precautions / Restrictions Precautions Precautions: Fall Restrictions Weight Bearing Restrictions: No      Mobility  Bed Mobility Overal bed mobility: Needs Assistance Bed Mobility: Supine to Sit;Sit to Supine     Supine to sit: Min assist;HOB elevated Sit to supine: Min assist;HOB elevated   General bed mobility comments: Assist to elevate trunk into sitting. Assist to bring legs back up into stretcher returning to supine.    Transfers Overall transfer level: Needs assistance Equipment used: 4-wheeled walker Transfers: Sit to/from Stand Sit to Stand: Min assist         General transfer comment: Assist for balance on rising to  stand.  Ambulation/Gait Ambulation/Gait assistance: Min assist;Min guard Gait Distance (Feet): 125 Feet Assistive device: 4-wheeled walker Gait Pattern/deviations: Step-through pattern;Decreased step length - right;Decreased step length - left;Decreased stride length;Trunk flexed Gait velocity: decr Gait velocity interpretation: 1.31 - 2.62 ft/sec, indicative of limited community ambulator General Gait Details: Initially min assist with rollator for balance as pt had not been up. As distance progressed pt with improving mobility and only required min guard assist. Pt able to manuever rollator without assist.  Stairs            Wheelchair Mobility    Modified Rankin (Stroke Patients Only)       Balance Overall balance assessment: Needs assistance Sitting-balance support: No upper extremity supported;Feet supported Sitting balance-Leahy Scale: Fair     Standing balance support: Bilateral upper extremity supported Standing balance-Leahy Scale: Poor Standing balance comment: rollator and min guard for static standing                             Pertinent Vitals/Pain Pain Assessment: No/denies pain    Home Living Family/patient expects to be discharged to:: Assisted living               Home Equipment: Cane - single point Additional Comments: Pt poor historian and info from chart in addition to pt    Prior Function Level of Independence: Needs assistance   Gait / Transfers Assistance Needed: Pt amb with recent use of cane and with history of frequent falls. Pt poor historian and info from chart in addition to pt  Hand Dominance        Extremity/Trunk Assessment   Upper Extremity Assessment Upper Extremity Assessment: Defer to OT evaluation    Lower Extremity Assessment Lower Extremity Assessment: Generalized weakness       Communication   Communication: HOH  Cognition Arousal/Alertness: Awake/alert Behavior During Therapy:  WFL for tasks assessed/performed Overall Cognitive Status: No family/caregiver present to determine baseline cognitive functioning Area of Impairment: Orientation;Attention;Memory;Following commands;Safety/judgement;Awareness;Problem solving                 Orientation Level: Disoriented to;Time Current Attention Level: Sustained Memory: Decreased short-term memory Following Commands: Follows one step commands with increased time Safety/Judgement: Decreased awareness of deficits;Decreased awareness of safety Awareness: Intellectual Problem Solving: Slow processing;Decreased initiation;Difficulty sequencing;Requires verbal cues;Requires tactile cues        General Comments      Exercises     Assessment/Plan    PT Assessment Patient needs continued PT services  PT Problem List Decreased strength;Decreased balance;Decreased cognition;Decreased knowledge of precautions;Decreased knowledge of use of DME;Decreased mobility;Decreased activity tolerance;Decreased safety awareness       PT Treatment Interventions DME instruction;Functional mobility training;Balance training;Patient/family education;Gait training;Therapeutic activities;Therapeutic exercise    PT Goals (Current goals can be found in the Care Plan section)  Acute Rehab PT Goals Patient Stated Goal: not stated PT Goal Formulation: With patient Time For Goal Achievement: 08/24/21 Potential to Achieve Goals: Fair    Frequency Min 3X/week   Barriers to discharge        Co-evaluation               AM-PAC PT "6 Clicks" Mobility  Outcome Measure Help needed turning from your back to your side while in a flat bed without using bedrails?: A Little Help needed moving from lying on your back to sitting on the side of a flat bed without using bedrails?: A Little Help needed moving to and from a bed to a chair (including a wheelchair)?: A Little Help needed standing up from a chair using your arms (e.g.,  wheelchair or bedside chair)?: A Little Help needed to walk in hospital room?: A Little Help needed climbing 3-5 steps with a railing? : A Lot 6 Click Score: 17    End of Session Equipment Utilized During Treatment: Gait belt Activity Tolerance: Patient tolerated treatment well Patient left: in bed;Other (comment) (moving to new room)   PT Visit Diagnosis: Unsteadiness on feet (R26.81);Other abnormalities of gait and mobility (R26.89);Repeated falls (R29.6)    Time: 1205-1220 PT Time Calculation (min) (ACUTE ONLY): 15 min   Charges:   PT Evaluation $PT Eval Moderate Complexity: [place] Pager [phone] Office [place] 08/10/2021, 2:36 PM

## 2021-08-10 NOTE — ED Notes (Signed)
Pt scaned had 530 in bladder,

## 2021-08-11 DIAGNOSIS — F319 Bipolar disorder, unspecified: Secondary | ICD-10-CM

## 2021-08-11 DIAGNOSIS — G250 Essential tremor: Secondary | ICD-10-CM

## 2021-08-11 DIAGNOSIS — R404 Transient alteration of awareness: Secondary | ICD-10-CM

## 2021-08-11 LAB — CBC
HCT: 37 % (ref 36.0–46.0)
Hemoglobin: 12.4 g/dL (ref 12.0–15.0)
MCH: 28.6 pg (ref 26.0–34.0)
MCHC: 33.5 g/dL (ref 30.0–36.0)
MCV: 85.5 fL (ref 80.0–100.0)
Platelets: 222 10*3/uL (ref 150–400)
RBC: 4.33 MIL/uL (ref 3.87–5.11)
RDW: 14 % (ref 11.5–15.5)
WBC: 7 10*3/uL (ref 4.0–10.5)
nRBC: 0 % (ref 0.0–0.2)

## 2021-08-11 LAB — COMPREHENSIVE METABOLIC PANEL
ALT: 12 U/L (ref 0–44)
AST: 17 U/L (ref 15–41)
Albumin: 2.8 g/dL — ABNORMAL LOW (ref 3.5–5.0)
Alkaline Phosphatase: 39 U/L (ref 38–126)
Anion gap: 11 (ref 5–15)
BUN: 16 mg/dL (ref 8–23)
CO2: 24 mmol/L (ref 22–32)
Calcium: 8.3 mg/dL — ABNORMAL LOW (ref 8.9–10.3)
Chloride: 94 mmol/L — ABNORMAL LOW (ref 98–111)
Creatinine, Ser: 0.76 mg/dL (ref 0.44–1.00)
GFR, Estimated: >60 mL/min (ref >60)
Glucose, Bld: 93 mg/dL (ref 70–99)
Potassium: 3.5 mmol/L (ref 3.5–5.1)
Sodium: 129 mmol/L — ABNORMAL LOW (ref 135–145)
Total Bilirubin: 0.7 mg/dL (ref 0.3–1.2)
Total Protein: 5.6 g/dL — ABNORMAL LOW (ref 6.5–8.1)

## 2021-08-11 LAB — GLUCOSE, CAPILLARY
Glucose-Capillary: 97 mg/dL (ref 70–99)
Glucose-Capillary: 98 mg/dL (ref 70–99)

## 2021-08-11 MED ORDER — DONEPEZIL HCL 5 MG PO TABS: 5.0000 mg | ORAL_TABLET | Freq: Every day | ORAL | Status: DC

## 2021-08-11 MED ORDER — CLONAZEPAM 0.25 MG PO TBDP: 0.2500 mg | ORAL_TABLET | Freq: Every day | ORAL | Status: DC | PRN

## 2021-08-11 MED ORDER — DIVALPROEX SODIUM 125 MG PO DR TAB
125.0000 mg | DELAYED_RELEASE_TABLET | Freq: Every day | ORAL | Status: DC
  Administered 2021-08-11: 125 mg via ORAL
  Filled 2021-08-11: qty 1

## 2021-08-11 MED ORDER — CLONAZEPAM 0.5 MG PO TABS: ORAL_TABLET | ORAL | 0 refills | Status: DC

## 2021-08-11 MED ORDER — MELATONIN 5 MG PO TABS: 10.0000 mg | ORAL_TABLET | Freq: Every day | ORAL | Status: DC

## 2021-08-11 MED ORDER — LOSARTAN POTASSIUM 25 MG PO TABS
25.0000 mg | ORAL_TABLET | Freq: Every day | ORAL | Status: DC
  Administered 2021-08-11: 25 mg via ORAL
  Filled 2021-08-11: qty 1

## 2021-08-11 NOTE — TOC Initial Note (Addendum)
Transition of Care [facility]) - Initial/Assessment Note    Patient Details  Name: Megan Flores MRN: [id number] Date of Birth: November 25, 1934  [facility] of [facility]) CM/SW Contact:    Marilu Favre, RN Phone Number: 08/11/2021, 12:41 PM  Clinical Narrative:                 Spoke to patient and husband Megan Flores at bedside. PAtient from [facility] . Patient wants to return, husband in agreement.    Called [facility] spoke to admissions yvette, read PT note and recommendations to her, they can take patient back, but not until tomorrow. Yvette asking for [facility] and HHPT/OT orders and face to face be faxed to her today at [phone]. Once signed NCM will fax ,  MD , patient and husband updated.     60 Husband telling nurse , he called Megan Flores and they can take his wife back. NCM called Megan Flores , spoke to Megan Flores, Megan Flores is at lunch , she will ask someone else .  Spoke to [facility] they cannot take patient until tomorrow    [facility] has accepted patient back today. Clinicals faxed. Nurse to call report to Megan Flores at [phone] and then husband can transport  Expected Discharge Plan: Assisted Living     Patient Goals and CMS Choice Patient states their goals for this hospitalization and ongoing recovery are:: to return to [email or web address] Compare Post Acute Care list provided to:: Patient Represenative (must comment) (Husband Megan Flores) Choice offered to / list presented to : Spouse, Patient  Expected Discharge Plan and Services Expected Discharge Plan: Assisted Living   Discharge Planning Services: CM Consult   Living arrangements for the past 2 months: Apartment                 DME Arranged: N/A         HH Arranged: OT, PT          Prior Living Arrangements/Services Living arrangements for the past 2 months: Apartment Lives with:: Self Patient language and need for interpreter reviewed:: Yes Do you feel  safe going back to the place where you live?: Yes          Current home services: DME Criminal Activity/Legal Involvement Pertinent to Current Situation/Hospitalization: No - Comment as needed  Activities of Daily Living Home Assistive Devices/Equipment: Eyeglasses, Gilford Rile (specify type) ADL Screening (condition at time of admission) Patient's cognitive ability adequate to safely complete daily activities?: No Is the patient deaf or have difficulty hearing?: No Does the patient have difficulty seeing, even when wearing glasses/contacts?: Yes Does the patient have difficulty concentrating, remembering, or making decisions?: Yes Patient able to express need for assistance with ADLs?: Yes Does the patient have difficulty dressing or bathing?: Yes Independently performs ADLs?: No Communication: Independent Dressing (OT): Needs assistance Is this a change from baseline?: Pre-admission baseline Grooming: Needs assistance Is this a change from baseline?: Pre-admission baseline Feeding: Independent Bathing: Needs assistance Is this a change from baseline?: Pre-admission baseline Toileting: Needs assistance Is this a change from baseline?: Change from baseline, expected to last >3days In/Out Bed: Needs assistance Is this a change from baseline?: Pre-admission baseline Walks in Home: Needs assistance Is this a change from baseline?: Pre-admission baseline Does the patient have difficulty walking or climbing stairs?: Yes Weakness of Legs: Both Weakness of Arms/Hands: Both  Permission Sought/Granted   Permission granted to share information with : Yes, Verbal Permission Granted  Share Information with NAME: Sydel Wilensky           Emotional Assessment              Admission diagnosis:  Altered mental status [R41.82] Hypothermia, initial encounter [T68.XXXA] Altered mental status, unspecified altered mental status type [R41.82] Frequent falls [R29.6] Patient Active Problem  List   Diagnosis Date Noted   Altered mental status 08/09/2021   Personal history of other malignant neoplasm of skin 10/23/2020   Fasting hyperglycemia 12/02/2016   Frequent falls 07/20/2016   Advanced directives, counseling/discussion 11/06/2015   Chronic hyponatremia 09/13/2014   Chronic insomnia 09/13/2014   Catatonia 03/03/2014   Delirium due to multiple etiologies, acute, hyperactive 02/26/2014   Essential tremor 06/14/2013   Reactive airway disease 05/25/2013   Bipolar 1 disorder ([place]) 05/25/2013   Meningioma ([place]) 05/25/2013   Osteoarthritis 05/25/2013   Peripheral neuropathy 05/25/2013   Pure hypercholesterolemia 05/25/2013   PCP:  Hortencia Pilar, MD Pharmacy:   [facility] [place] [address] Phone: [phone] Fax: [place], [place] [address] [place] [address] Phone: [phone] Fax: [phone]     Social Determinants of Health (SDOH) Interventions    Readmission Risk Interventions No flowsheet data found.

## 2021-08-11 NOTE — Discharge Summary (Signed)
Name: Megan Flores MRN: [id number] DOB: [birth year] 86 y.o. PCP: Hortencia Pilar, MD  Date of Admission: 08/09/2021  1:34 PM Date of Discharge: 08/11/21 Attending Physician: Sid Falcon, MD  Discharge Diagnosis: 1. Altered Mental Status 2. Cognitive decline  Discharge Medications: Allergies as of 08/11/2021       Reactions   Aspirin Other (See Comments)   ULCERS   Levofloxacin Other (See Comments)   Insomnia leading to mania (amoxicillin has always worked well for her)   Primidone Other (See Comments)   Dizziness   Propranolol Anaphylaxis, Other (See Comments)   Hypotension, also   Neomycin-bacitracin-polymyxin [bacitracin-neomycin-polymyxin] Rash   Neosporin [neomycin-bacitracin Zn-polymyx] Rash   Polysporin [bacitracin-polymyxin B] Rash   Topamax [topiramate] Other (See Comments)   Reaction??        Medication List     TAKE these medications    CITRACAL + D PO Take 1 tablet by mouth 2 (two) times daily.   clonazePAM 0.5 MG disintegrating tablet Commonly known as: KLONOPIN Take 0.25 mg by mouth daily as needed (anxiety/agitation). What changed: Another medication with the same name was changed. Make sure you understand how and when to take each.   clonazePAM 0.5 MG tablet Commonly known as: KLONOPIN Take 1 tablet (0.5 mg total) by mouth 2 (two) times daily for 7 days, THEN 0.5 tablets (0.25 mg total) 2 (two) times daily for 7 days, THEN 0.5 tablets (0.25 mg total) daily for 7 days. Start taking on: August 11, 2021 What changed: See the new instructions.   divalproex 125 MG DR tablet Commonly known as: DEPAKOTE Take 125 mg by mouth daily in the afternoon.   divalproex 250 MG DR tablet Commonly known as: DEPAKOTE Take 250 mg by mouth in the morning and at bedtime.   donepezil 5 MG tablet Commonly known as: ARICEPT Take 5 mg by mouth at bedtime.   hydrochlorothiazide 25 MG tablet Commonly known as: HYDRODIURIL Take 25 mg by mouth daily.    losartan 25 MG tablet Commonly known as: COZAAR Take 25 mg by mouth daily.   Melatonin 10 MG Tabs Take 10 mg by mouth at bedtime.   QUEtiapine 300 MG tablet Commonly known as: SEROQUEL Take 300 mg by mouth at bedtime.   simvastatin 20 MG tablet Commonly known as: ZOCOR Take 20 mg by mouth at bedtime.        Disposition and follow-up:   Ms.Megan Flores was discharged from [facility] in Stable condition.  At the hospital follow up visit please address:  1.  AMS, Memory loss, frequent falls: Patient presented due to altered mental status at ALF, not altered on exam on admission.  Originally there was concern for possible sepsis/infection, however patient's mental status improved without fever/leukocytosis/identified source of infection.  Low concern for infection and antibiotics were discontinued.  Patient has had slow decline in mental status even prior to radiation treatment earlier this year.  She has already started donepezil with PCP.  Should follow-up with PCP for chronic management of cognitive decline.  Repeat MoCA at next PCP visit to track cognitive decline.  Discussed delirium precautions with family. 2.  Redemonstrated choroid plexus mass and meningioma: Both stable per radiology.  Choroid plexus mass with small increase in growth since 2014.  Neither mass likely to be responsible for patient's mental status symptoms.  No evidence of obstruction of ventricular system.  Patient should follow-up with neurology outpatient.  2.  Labs / imaging needed at time of  follow-up: None  3.  Pending labs/ test needing follow-up: None  Follow-up Appointments:   Hospital Course by problem list:  Megan Flores is a 86 y.o. 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    #Memory Loss/cognitive decline #Recent Falls #Concern for infection Patient is oriented on exam.  Completed MoCA on 01/29/21 with a score of 20/30.  Has had recent decline and multiple falls over the last several months. On donepezil at home, held on admission.  Was referred to neurology but was never seen in neurology clinic. 2 masses noted on recent CT imaging unlikely to be cause of  symptoms. CTH negative for intracranial bleed or other acute abnormalities. Patient with mild elevation in lactic acid , hypothermic, and soft blood pressures on arrival, started on ED sepsis protocol. UA does not suggest urinary tract infection. Chest xray without sign of infection.  COVID-negative.  Negative CT abdomen pelvis. WBC nl and no obvious source of infection. Considered other etiologies like metabolic and medications, but no marked metabolic abnormalities and no new medications, Depakote level, TSH, B12, ammonia, ethanol all normal.  She takes Klonopin 0.5 mg twice daily scheduled which can contribute to fall risk, which was held. Patient was started on Vanc and Cefepime in the ED and received total 3 doses of Cefepime. Blood cultures with NGTD. On 9/19, she is back to her mental status baseline.  Blood cultures have remained without growth. Antibiotics were discontinued on 9/19.  Family is in agreement that the patient is back at her mental status baseline and agree with discharge on 9/19.  Patient has had a slow decline in cognitive function starting before radiation for [facility] per chart review and per family. Family notes symptoms of delirium throughout the day. Discussed with family the importance of orientation and redirection, and being in a familiar environment on a predictable routine.  Patient received electron-beam radiation to right temple which is superficial and does not penetrate to irradiate the brain. This form of radiation is unlikely to have significantly impacted the  patient's cognitive decline. Patient will need to follow-up with primary care provider for repeat MoCA and further management of cognitive decline.  Patient may be best suited for memory unit at ALF. We will restart donezepil at discharge.    #Intraventricular mass concerning for neoplasm #Meningioma, stable #History of SCC of scalp Per radiology, there is increase in size of intraventricular mass since imaging in 2014.  No increase in size from 9/7 to admission.  No obstruction on imaging.  Unlikely contributing to patient's current symptoms.  Patient will need referral to see neurology outpatient for further follow-up.   #HTN Patient takes the following home medications for blood pressure: HCTZ 25 mg daily, Losartan 25 mg daily. Antihypertensives were held given soft BPs on admission and concern for sepsis.  On 9/19 patient is normotensive.  Antihypertensives will be resumed at discharge.    #HLD Patient has history of hyperlipidemia and was continued on Simvastain 20 mg daily.   #Hyponatremia, chronic #Hyperkalemia, resolved K 5.3 and Na 133 on arrival.  She has history of chronic hyponatremia.  Potassium was repleted as necessary.  No further medical management required.   #Essential Tremor Diagnosed in 2014. She has failed treatment with Propanolol and Primidone, as well as Topamax.  Patient also on Klonopin  0.5 mg 3 times daily, currently holding, which is sometimes used in the treatment of ET though effectiveness is limited.  With her age and declining mental status would prefer patient not to be on Klonopin.  Intermittent RUE resting tremor likely secondary to essential tremor, can be seen in patients with long history of ET. Lack of rigidity in right upper extremity supports this. Head tremor and bilateral presentation of action tremor support essential tremor, low concern for Parkinson's. Patient is on Depakote which can cause tremors.   #Bipolar Type 1 Continue home Depakote.   Depakote level normal on admission.     Subjective on day of discharge: Acute events or concerns overnight.  Patient resting in bed in patient room.  She was able to tell us what she was watching on TV, and was on the phone with her daughter [name].  She is oriented x3, still believes she is hospitalized for fall though then corrects and states she is aware that she had an episode of confusion at her ALF.  She was able to tell [place] that today is her husband's birthday which was confirmed.  She seems to be back at her mental status baseline.  She denies shortness of breath, cough, fever, nausea, dysuria.  Discharge Exam:   BP (!) 150/68 (BP Location: Left Arm)   Pulse 78   Temp 98.4 F (36.9 C) (Oral)   Resp 19   Ht '5\' 4"'$  (1.626 m)   Wt 69.4 kg   SpO2 98%   BMI 26.26 kg/m  Physical Exam: General: Elderly Caucasian female, NAD HENT: normocephalic, atraumatic EYES: Anisocoria right 3 mm, left 2 mm PERRL, conjunctiva non-erythematous, no scleral icterus CV: regular rate, normal rhythm, no murmurs, rubs, gallops. Pulmonary: sating well on RA, lung clear to auscultation, no rales, wheezes, rhonchi Abdominal: non-distended, soft, non-tender to palpation, normal BS Skin: Warm and dry, no rashes or lesions Neurological: MS: awake, alert and oriented x3 (new name, place, town, year, month), normal speech and fund of knowledge Motor: moves all extremities antigravity Psych: normal affect   Pertinent Labs, Studies, and Procedures:  CBC Latest Ref Rng & Units 08/11/2021 08/10/2021 08/09/2021  WBC 4.0 - 10.5 K/uL 7.0 8.1 7.5  Hemoglobin 12.0 - 15.0 g/dL 12.4 14.5 13.5  Hematocrit 36.0 - 46.0 % 37.0 43.6 42.3  Platelets 150 - 400 K/uL 222 256 227   BMP Latest Ref Rng & Units 08/11/2021 08/10/2021 08/09/2021  Glucose 70 - 99 mg/dL 93 143(H) 105(H)  BUN 8 - 23 mg/dL '16 13 21  '$ Creatinine 0.44 - 1.00 mg/dL 0.76 0.85 0.96  Sodium 135 - 145 mmol/L 129(L) 135 133(L)  Potassium 3.5 - 5.1 mmol/L 3.5 3.8 5.3(H)   Chloride 98 - 111 mmol/L 94(L) 98 97(L)  CO2 22 - 32 mmol/L '24 26 24  '$ Calcium 8.9 - 10.3 mg/dL 8.3(L) 9.3 9.4   DG Chest 1 View  Result Date: 08/09/2021 CLINICAL DATA:  Chest pain EXAM: CHEST  1 VIEW COMPARISON:  Chest radiograph 11/12/2008 FINDINGS: The heart is not enlarged. The mediastinal contours are within normal limits. The pulmonary vasculature is somewhat prominent. There is no focal consolidation or overt pulmonary edema. There is no pleural effusion or pneumothorax. There is no acute osseous abnormality. IMPRESSION: No radiographic evidence of acute cardiopulmonary process. Electronically Signed   By: Valetta Mole M.D.   On: 08/09/2021 15:17   CT HEAD WO CONTRAST  Result Date: 08/09/2021 CLINICAL DATA:  Altered mental status.  Multiple falls. EXAM: CT HEAD  WITHOUT CONTRAST TECHNIQUE: Contiguous axial images were obtained from the base of the skull through the vertex without intravenous contrast. COMPARISON:  CT head 08/07/2021, CT head 07/30/2021, CT head 06/23/2013. BRAIN: BRAIN Brain: Cerebral ventricle sizes are concordant with the degree of cerebral volume loss. Patchy and confluent areas of decreased attenuation are noted throughout the deep and periventricular white matter of the cerebral hemispheres bilaterally, compatible with chronic microvascular ischemic disease. Incidentally noted asymmetric choroid plexus on the right compared to the left (6:26). No evidence of large-territorial acute infarction. Stable 1.6cm left infratentorial mass (5:64). No new mass lesion. No extra-axial collection. Mild mass effect from known mass onto the left cerebellar hemisphere inferiorly no midline shift. No hydrocephalus. Basilar cisterns are patent. Vascular: No hyperdense vessel. Atherosclerotic calcifications are present within the cavernous internal carotid and vertebral arteries. Skull: No acute fracture or focal lesion. Sinuses/Orbits: Paranasal sinuses and mastoid air cells are clear. Bilateral  lens replacement otherwise the orbits are unremarkable. Other: None. IMPRESSION: No acute intracranial abnormality in a patient with a stable 1.6cm left infratentorial mass likely representing a meningioma. These results were called by telephone at the time of interpretation on 08/09/2021 at 4:12 pm to provider Pattricia Boss MD, who verbally acknowledged these results. Electronically Signed   By: Iven Finn M.D.   On: 08/09/2021 16:12   CT ABDOMEN PELVIS W CONTRAST  Result Date: 08/09/2021 CLINICAL DATA:  Abdominal pain. EXAM: CT ABDOMEN AND PELVIS WITH CONTRAST TECHNIQUE: Multidetector CT imaging of the abdomen and pelvis was performed using the standard protocol following bolus administration of intravenous contrast. CONTRAST:  12m OMNIPAQUE IOHEXOL 350 MG/ML SOLN COMPARISON:  None. FINDINGS: Lower chest: No acute abnormality. Hepatobiliary: No focal liver abnormality is seen. No gallstones, gallbladder wall thickening, or biliary dilatation. Pancreas: Unremarkable. No pancreatic ductal dilatation or surrounding inflammatory changes. Spleen: Normal in size without focal abnormality. Adrenals/Urinary Tract: There is a 16 mm cyst in the right kidney. There is a hypodensity in the left kidney which is too small to characterize. Kidneys otherwise appear within normal limits. There is no hydronephrosis or perinephric fluid. The bladder is distended and within normal limits. Stomach/Bowel: Stomach is within normal limits. Appendix appears normal. No evidence of bowel wall thickening, distention, or inflammatory changes. There is sigmoid colon diverticulosis without evidence for acute diverticulitis. Vascular/Lymphatic: Aortic atherosclerosis. No enlarged abdominal or pelvic lymph nodes. Reproductive: Status post hysterectomy. No adnexal masses. Other: No abdominal wall hernia or abnormality. No abdominopelvic ascites. Musculoskeletal: The bones are osteopenic. There is no acute fracture or dislocation. L1  vertebral body hemangioma is noted. IMPRESSION: 1. No acute localizing process in the abdomen or pelvis. 2. Sigmoid colon diverticulosis without evidence for diverticulitis. 3.  Aortic Atherosclerosis (ICD10-I70.0). Electronically Signed   By: ARonney AstersM.D.   On: 08/09/2021 18:27   Results for orders placed or performed during the hospital encounter of 08/09/21  Blood culture (routine x 2)     Status: None (Preliminary result)   Collection Time: 08/09/21  3:28 PM   Specimen: BLOOD  Result Value Ref Range Status   Specimen Description BLOOD RIGHT ANTECUBITAL  Final   Special Requests   Final    BOTTLES DRAWN AEROBIC AND ANAEROBIC Blood Culture adequate volume   Culture   Final    NO GROWTH 2 DAYS Performed at [facility] [address] [place] [address]   Report Status PENDING  Incomplete  Blood culture (routine x 2)     Status:  None (Preliminary result)   Collection Time: 08/09/21  4:11 PM   Specimen: BLOOD RIGHT HAND  Result Value Ref Range Status   Specimen Description BLOOD RIGHT HAND  Final   Special Requests   Final    BOTTLES DRAWN AEROBIC ONLY Blood Culture results may not be optimal due to an inadequate volume of blood received in culture bottles   Culture   Final    NO GROWTH 2 DAYS Performed at [facility], [place] [address], [place], [place] [address]    Report Status PENDING  Incomplete  Resp Panel by RT-PCR (Flu A&B, Covid) Nasopharyngeal Swab     Status: None   Collection Time: 08/09/21  5:09 PM   Specimen: Nasopharyngeal Swab; Nasopharyngeal(NP) swabs in vial transport medium  Result Value Ref Range Status   SARS Coronavirus 2 by RT PCR NEGATIVE NEGATIVE Final    Comment: (NOTE) SARS-CoV-2 target nucleic acids are NOT DETECTED.  The SARS-CoV-2 RNA is generally detectable in upper respiratory specimens during the acute phase of infection. The lowest concentration of SARS-CoV-2 viral copies this assay can detect is 138 copies/mL. A  negative result does not preclude SARS-Cov-2 infection and should not be used as the sole basis for treatment or other patient management decisions. A negative result may occur with  improper specimen collection/handling, submission of specimen other than nasopharyngeal swab, presence of viral mutation(s) within the areas targeted by this assay, and inadequate number of viral copies(<138 copies/mL). A negative result must be combined with clinical observations, patient history, and epidemiological information. The expected result is Negative.  Fact Sheet for Patients:  EntrepreneurPulse.com.au  Fact Sheet for Healthcare Providers:  IncredibleEmployment.be  This test is no t yet approved or cleared by the [place] FDA and  has been authorized for detection and/or diagnosis of SARS-CoV-2 by FDA under an Emergency Use Authorization (EUA). This EUA will remain  in effect (meaning this test can be used) for the duration of the COVID-19 declaration under Section 564(b)(1) of the Act, 21 U.S.C.section 360bbb-3(b)(1), unless the authorization is terminated  or revoked sooner.       Influenza A by PCR NEGATIVE NEGATIVE Final   Influenza B by PCR NEGATIVE NEGATIVE Final    Comment: (NOTE) The Xpert Xpress SARS-CoV-2/FLU/RSV plus assay is intended as an aid in the diagnosis of influenza from Nasopharyngeal swab specimens and should not be used as a sole basis for treatment. Nasal washings and aspirates are unacceptable for Xpert Xpress SARS-CoV-2/FLU/RSV testing.  Fact Sheet for Patients: EntrepreneurPulse.com.au  Fact Sheet for Healthcare Providers: IncredibleEmployment.be  This test is not yet approved or cleared by the [place] FDA and has been authorized for detection and/or diagnosis of SARS-CoV-2 by FDA under an Emergency Use Authorization (EUA). This EUA will remain in effect (meaning this test can  be used) for the duration of the COVID-19 declaration under Section 564(b)(1) of the Act, 21 U.S.C. section 360bbb-3(b)(1), unless the authorization is terminated or revoked.  Performed at [facility], [place] [address], [place], [place] [address]      Discharge Instructions: Discharge Instructions     Call MD for:  extreme fatigue   Complete by: As directed    Call MD for:  persistant dizziness or light-headedness   Complete by: As directed    Call MD for:  persistant nausea and vomiting   Complete by: As directed    Call MD for:  redness, tenderness, or signs of infection (pain, swelling, redness, odor  or green/yellow discharge around incision site)   Complete by: As directed    Call MD for:  severe uncontrolled pain   Complete by: As directed    Call MD for:  temperature >100.4   Complete by: As directed        Signed: Wayland Denis, MD 08/11/2021, 3:05 PM   Pager: [phone]

## 2021-08-11 NOTE — Progress Notes (Signed)
PT Cancellation Note  Patient Details Name: Megan Flores MRN: [id number] DOB: 1935/06/26   Cancelled Treatment:    Reason Eval/Treat Not Completed: Other (comment).  Pt has been given dc information and is expecting to leave shortly.  Follow up if dc is delayed.   Ramond Dial 08/11/2021, 3:56 PM  Mee Hives, PT MS Acute Rehab Dept. Number: [place] and [place]

## 2021-08-11 NOTE — Progress Notes (Signed)
Megan Flores to be D/C'd  per MD order.  Discussed with the patient and all questions fully answered. Gave reported to [place] at [facility] facility. Patient is known to the nursing home.  VSS, Skin clean, dry and intact without evidence of skin break down, no evidence of skin tears noted.  IV catheter discontinued intact. Site without signs and symptoms of complications. Dressing and pressure applied.  An After Visit Summary was printed and given to the patient. Patient received prescription.   Patient instructed to return to ED, call 911, or call MD for any changes in condition.   Patient to be escorted via [place], and D/C home via private auto.

## 2021-08-11 NOTE — Progress Notes (Signed)
   08/11/21 1026  Clinical Encounter Type  Visited With Patient and family together  Visit Type Initial;Spiritual support  Referral From Nurse  Consult/Referral To Chaplain   Chaplain responded to spiritual care consult. Pt stated she didn't request chaplain. Pt shared her faith and requested prayer. Chaplain prayed with Pt for peace. Chaplain remains available.  This note was prepared by Chaplain Resident, Dante Gang, MDiv. Chaplain remains available as needed through the on-call pager: [phone].

## 2021-08-11 NOTE — NC FL2 (Addendum)
[place] LEVEL OF CARE SCREENING TOOL     IDENTIFICATION  Patient Name: Megan Flores Birthdate: [birth year] Sex: female Admission Date (Current Location): 08/09/2021  [facility] and [place] Number:  Herbalist and Address:  [facility] [place], [place] [address], [place], [place] [address]      Provider Number: [id number]  Attending Physician Name and Address:  Sid Falcon, MD  Relative Name and Phone Number:  spouse Mayleen Powderly  [phone]    Current Level of Care: Hospital Recommended Level of Care: [place] Prior Approval Number:    Date Approved/Denied:   PASRR Number:    Discharge Plan:  (ALF)    Current Diagnoses: Patient Active Problem List   Diagnosis Date Noted   Altered mental status 08/09/2021   Personal history of other malignant neoplasm of skin 10/23/2020   Fasting hyperglycemia 12/02/2016   Frequent falls 07/20/2016   Advanced directives, counseling/discussion 11/06/2015   Chronic hyponatremia 09/13/2014   Chronic insomnia 09/13/2014   Catatonia 03/03/2014   Delirium due to multiple etiologies, acute, hyperactive 02/26/2014   Essential tremor 06/14/2013   Reactive airway disease 05/25/2013   Bipolar 1 disorder ([place]) 05/25/2013   Meningioma ([place]) 05/25/2013   Osteoarthritis 05/25/2013   Peripheral neuropathy 05/25/2013   Pure hypercholesterolemia 05/25/2013    Orientation RESPIRATION BLADDER Height & Weight     Self  Normal Continent Weight: 69.4 kg Height:  '5\' 4"'$  (162.6 cm)  BEHAVIORAL SYMPTOMS/MOOD NEUROLOGICAL BOWEL NUTRITION STATUS      Continent Diet  AMBULATORY STATUS COMMUNICATION OF NEEDS Skin   Limited Assist Verbally Normal                       Personal Care Assistance Level of Assistance  Bathing, Feeding, Dressing Bathing Assistance: Limited assistance Feeding assistance: Independent Dressing Assistance: Limited assistance     Functional Limitations  Info  Sight Sight Info: Adequate        SPECIAL CARE FACTORS FREQUENCY  PT (By licensed PT), OT (By licensed OT)     PT Frequency: 3 times a week OT Frequency: 3 times a week            Contractures Contractures Info: Not present    Additional Factors Info  Code Status, Allergies Code Status Info: Full code Allergies Info: Aspirin, Levofloxacin, Primidone, Neosporin, polysporin, neomycin- bacitracin polymycin, topmax           Current Medications (08/11/2021):  This is the current hospital active medication list Current Facility-Administered Medications  Medication Dose Route Frequency Provider Last Rate Last Admin   acetaminophen (TYLENOL) tablet 650 mg  650 mg Oral Q6H PRN Madalyn Rob, MD       Or   acetaminophen (TYLENOL) suppository 650 mg  650 mg Rectal Q6H PRN Madalyn Rob, MD       divalproex (DEPAKOTE) DR tablet 250 mg  250 mg Oral Q12H Madalyn Rob, MD   250 mg at 08/11/21 0958   enoxaparin (LOVENOX) injection 40 mg  40 mg Subcutaneous Q24H Madalyn Rob, MD   40 mg at 08/10/21 2012   simvastatin (ZOCOR) tablet 20 mg  20 mg Oral QHS Madalyn Rob, MD         Discharge Medications: CITRACAL + D PO Take 1 tablet by mouth 2 (two) times daily.    clonazePAM 0.5 MG disintegrating tablet Commonly known as: KLONOPIN Take 0.25 mg by mouth daily as needed (anxiety/agitation). What  changed: Another medication with the same name was changed. Make sure you understand how and when to take each.    clonazePAM 0.5 MG tablet Commonly known as: KLONOPIN Take 1 tablet (0.5 mg total) by mouth 2 (two) times daily for 7 days, THEN 0.5 tablets (0.25 mg total) 2 (two) times daily for 7 days, THEN 0.5 tablets (0.25 mg total) daily for 7 days. Start taking on: August 11, 2021 What changed: See the new instructions.    divalproex 125 MG DR tablet Commonly known as: DEPAKOTE Take 125 mg by mouth daily in the afternoon.    divalproex 250 MG DR tablet Commonly known as:  DEPAKOTE Take 250 mg by mouth in the morning and at bedtime.    donepezil 5 MG tablet Commonly known as: ARICEPT Take 5 mg by mouth at bedtime.    hydrochlorothiazide 25 MG tablet Commonly known as: HYDRODIURIL Take 25 mg by mouth daily.    losartan 25 MG tablet Commonly known as: COZAAR Take 25 mg by mouth daily.    Melatonin 10 MG Tabs Take 10 mg by mouth at bedtime.    QUEtiapine 300 MG tablet Commonly known as: SEROQUEL Take 300 mg by mouth at bedtime.    simvastatin 20 MG tablet Commonly known as: ZOCOR Take 20 mg by mouth at bedtime.     Relevant Imaging Results:  Relevant Lab Results:   Additional Information [id number]  Kona Yusuf, Edson Snowball, RN

## 2021-08-14 LAB — CULTURE, BLOOD (ROUTINE X 2)
Culture: NO GROWTH
Culture: NO GROWTH
Special Requests: ADEQUATE

## 2021-08-27 ENCOUNTER — Emergency Department (HOSPITAL_COMMUNITY): Payer: Medicare Other

## 2021-08-27 ENCOUNTER — Other Ambulatory Visit: Payer: Self-pay

## 2021-08-27 ENCOUNTER — Inpatient Hospital Stay (HOSPITAL_COMMUNITY)
Admission: EM | Admit: 2021-08-27 | Discharge: 2021-08-28 | DRG: 177 | Disposition: A | Discharge status: Skilled Nursing Facility | Payer: Medicare Other | Attending: Internal Medicine | Admitting: Internal Medicine

## 2021-08-27 DIAGNOSIS — Z8249 Family history of ischemic heart disease and other diseases of the circulatory system: Secondary | ICD-10-CM

## 2021-08-27 DIAGNOSIS — I2699 Other pulmonary embolism without acute cor pulmonale: Secondary | ICD-10-CM | POA: Diagnosis present

## 2021-08-27 DIAGNOSIS — G629 Polyneuropathy, unspecified: Secondary | ICD-10-CM | POA: Diagnosis present

## 2021-08-27 DIAGNOSIS — Z853 Personal history of malignant neoplasm of breast: Secondary | ICD-10-CM | POA: Diagnosis not present

## 2021-08-27 DIAGNOSIS — Z886 Allergy status to analgesic agent status: Secondary | ICD-10-CM

## 2021-08-27 DIAGNOSIS — Z803 Family history of malignant neoplasm of breast: Secondary | ICD-10-CM

## 2021-08-27 DIAGNOSIS — E876 Hypokalemia: Secondary | ICD-10-CM | POA: Diagnosis present

## 2021-08-27 DIAGNOSIS — Z888 Allergy status to other drugs, medicaments and biological substances status: Secondary | ICD-10-CM | POA: Diagnosis not present

## 2021-08-27 DIAGNOSIS — I10 Essential (primary) hypertension: Secondary | ICD-10-CM | POA: Diagnosis present

## 2021-08-27 DIAGNOSIS — G250 Essential tremor: Secondary | ICD-10-CM | POA: Diagnosis present

## 2021-08-27 DIAGNOSIS — J45909 Unspecified asthma, uncomplicated: Secondary | ICD-10-CM | POA: Diagnosis present

## 2021-08-27 DIAGNOSIS — Z85828 Personal history of other malignant neoplasm of skin: Secondary | ICD-10-CM

## 2021-08-27 DIAGNOSIS — U071 COVID-19: Secondary | ICD-10-CM | POA: Diagnosis present

## 2021-08-27 DIAGNOSIS — E7800 Pure hypercholesterolemia, unspecified: Secondary | ICD-10-CM | POA: Diagnosis present

## 2021-08-27 DIAGNOSIS — J9601 Acute respiratory failure with hypoxia: Secondary | ICD-10-CM | POA: Diagnosis present

## 2021-08-27 DIAGNOSIS — Z923 Personal history of irradiation: Secondary | ICD-10-CM

## 2021-08-27 DIAGNOSIS — R55 Syncope and collapse: Secondary | ICD-10-CM | POA: Diagnosis present

## 2021-08-27 DIAGNOSIS — R680 Hypothermia, not associated with low environmental temperature: Secondary | ICD-10-CM | POA: Diagnosis present

## 2021-08-27 DIAGNOSIS — Z881 Allergy status to other antibiotic agents status: Secondary | ICD-10-CM | POA: Diagnosis not present

## 2021-08-27 DIAGNOSIS — I2693 Single subsegmental pulmonary embolism without acute cor pulmonale: Secondary | ICD-10-CM

## 2021-08-27 DIAGNOSIS — F319 Bipolar disorder, unspecified: Secondary | ICD-10-CM | POA: Diagnosis present

## 2021-08-27 LAB — OSMOLALITY, URINE: Osmolality, Ur: 145 mOsm/kg — ABNORMAL LOW (ref 300–900)

## 2021-08-27 LAB — CBC WITH DIFFERENTIAL/PLATELET
Abs Immature Granulocytes: 0.03 10*3/uL (ref 0.00–0.07)
Basophils Absolute: 0 10*3/uL (ref 0.0–0.1)
Basophils Relative: 0 %
Eosinophils Absolute: 0.1 10*3/uL (ref 0.0–0.5)
Eosinophils Relative: 1 %
HCT: 35.3 % — ABNORMAL LOW (ref 36.0–46.0)
Hemoglobin: 11.7 g/dL — ABNORMAL LOW (ref 12.0–15.0)
Immature Granulocytes: 0 %
Lymphocytes Relative: 18 %
Lymphs Abs: 1.4 10*3/uL (ref 0.7–4.0)
MCH: 28.5 pg (ref 26.0–34.0)
MCHC: 33.1 g/dL (ref 30.0–36.0)
MCV: 85.9 fL (ref 80.0–100.0)
Monocytes Absolute: 1.3 10*3/uL — ABNORMAL HIGH (ref 0.1–1.0)
Monocytes Relative: 16 %
Neutro Abs: 5.2 10*3/uL (ref 1.7–7.7)
Neutrophils Relative %: 65 %
Platelets: 229 10*3/uL (ref 150–400)
RBC: 4.11 MIL/uL (ref 3.87–5.11)
RDW: 13.5 % (ref 11.5–15.5)
WBC: 8.1 10*3/uL (ref 4.0–10.5)
nRBC: 0 % (ref 0.0–0.2)

## 2021-08-27 LAB — URINE CULTURE

## 2021-08-27 LAB — OSMOLALITY: Osmolality: 274 mOsm/kg — ABNORMAL LOW (ref 275–295)

## 2021-08-27 LAB — RESP PANEL BY RT-PCR (FLU A&B, COVID) ARPGX2
Influenza A by PCR: NEGATIVE
Influenza B by PCR: NEGATIVE
SARS Coronavirus 2 by RT PCR: POSITIVE — AB

## 2021-08-27 LAB — TROPONIN I (HIGH SENSITIVITY)
Troponin I (High Sensitivity): 6 ng/L (ref <18)
Troponin I (High Sensitivity): 5 ng/L (ref <18)

## 2021-08-27 LAB — MAGNESIUM: Magnesium: 1.9 mg/dL (ref 1.7–2.4)

## 2021-08-27 LAB — URINALYSIS, ROUTINE W REFLEX MICROSCOPIC
Bilirubin Urine: NEGATIVE
Glucose, UA: NEGATIVE mg/dL
Hgb urine dipstick: NEGATIVE
Ketones, ur: NEGATIVE mg/dL
Leukocytes,Ua: NEGATIVE
Nitrite: NEGATIVE
Protein, ur: NEGATIVE mg/dL
Specific Gravity, Urine: 1.004 — ABNORMAL LOW (ref 1.005–1.030)
pH: 7 (ref 5.0–8.0)

## 2021-08-27 LAB — LACTIC ACID, PLASMA
Lactic Acid, Venous: 2.8 mmol/L (ref 0.5–1.9)
Lactic Acid, Venous: 2.6 mmol/L (ref 0.5–1.9)

## 2021-08-27 LAB — SODIUM, URINE, RANDOM: Sodium, Ur: 33 mmol/L

## 2021-08-27 LAB — COMPREHENSIVE METABOLIC PANEL
ALT: 7 U/L (ref 0–44)
AST: 18 U/L (ref 15–41)
Albumin: 3.1 g/dL — ABNORMAL LOW (ref 3.5–5.0)
Alkaline Phosphatase: 49 U/L (ref 38–126)
Anion gap: 11 (ref 5–15)
BUN: 11 mg/dL (ref 8–23)
CO2: 25 mmol/L (ref 22–32)
Calcium: 8.7 mg/dL — ABNORMAL LOW (ref 8.9–10.3)
Chloride: 93 mmol/L — ABNORMAL LOW (ref 98–111)
Creatinine, Ser: 0.81 mg/dL (ref 0.44–1.00)
GFR, Estimated: >60 mL/min (ref >60)
Glucose, Bld: 136 mg/dL — ABNORMAL HIGH (ref 70–99)
Potassium: 3.2 mmol/L — ABNORMAL LOW (ref 3.5–5.1)
Sodium: 129 mmol/L — ABNORMAL LOW (ref 135–145)
Total Bilirubin: 0.5 mg/dL (ref 0.3–1.2)
Total Protein: 5.8 g/dL — ABNORMAL LOW (ref 6.5–8.1)

## 2021-08-27 LAB — PHOSPHORUS: Phosphorus: 3.5 mg/dL (ref 2.5–4.6)

## 2021-08-27 LAB — CBG MONITORING, ED: Glucose-Capillary: 131 mg/dL — ABNORMAL HIGH (ref 70–99)

## 2021-08-27 LAB — BRAIN NATRIURETIC PEPTIDE: B Natriuretic Peptide: 29.5 pg/mL (ref 0.0–100.0)

## 2021-08-27 LAB — VALPROIC ACID LEVEL: Valproic Acid Lvl: 54 ug/mL (ref 50.0–100.0)

## 2021-08-27 LAB — CK: Total CK: 107 U/L (ref 38–234)

## 2021-08-27 MED ORDER — SODIUM CHLORIDE 0.9 % IV SOLN
200.0000 mg | Freq: Once | INTRAVENOUS | Status: AC
  Administered 2021-08-27: 200 mg via INTRAVENOUS
  Filled 2021-08-27: qty 40

## 2021-08-27 MED ORDER — POTASSIUM CHLORIDE 10 MEQ/100ML IV SOLN
INTRAVENOUS | Status: AC
  Administered 2021-08-27: 10 meq via INTRAVENOUS
  Filled 2021-08-27: qty 100

## 2021-08-27 MED ORDER — DIVALPROEX SODIUM 250 MG PO DR TAB
250.0000 mg | DELAYED_RELEASE_TABLET | Freq: Two times a day (BID) | ORAL | Status: DC
  Administered 2021-08-27 – 2021-08-28 (×3): 250 mg via ORAL
  Filled 2021-08-27 (×5): qty 1

## 2021-08-27 MED ORDER — DEXAMETHASONE SODIUM PHOSPHATE 4 MG/ML IJ SOLN
4.0000 mg | Freq: Once | INTRAMUSCULAR | Status: AC
  Administered 2021-08-27: 4 mg via INTRAVENOUS
  Filled 2021-08-27: qty 1

## 2021-08-27 MED ORDER — SIMVASTATIN 20 MG PO TABS
20.0000 mg | ORAL_TABLET | Freq: Every day | ORAL | Status: DC
  Administered 2021-08-28: 20 mg via ORAL
  Filled 2021-08-27: qty 1

## 2021-08-27 MED ORDER — POTASSIUM CHLORIDE 10 MEQ/100ML IV SOLN
10.0000 meq | INTRAVENOUS | Status: AC
  Administered 2021-08-27 (×3): 10 meq via INTRAVENOUS
  Filled 2021-08-27 (×3): qty 100

## 2021-08-27 MED ORDER — DEXAMETHASONE SODIUM PHOSPHATE 10 MG/ML IJ SOLN
6.0000 mg | INTRAMUSCULAR | Status: DC
  Administered 2021-08-28: 6 mg via INTRAVENOUS
  Filled 2021-08-27: qty 1

## 2021-08-27 MED ORDER — APIXABAN 5 MG PO TABS
10.0000 mg | ORAL_TABLET | Freq: Two times a day (BID) | ORAL | Status: DC
  Administered 2021-08-27 – 2021-08-28 (×3): 10 mg via ORAL
  Filled 2021-08-27 (×3): qty 2

## 2021-08-27 MED ORDER — HEPARIN BOLUS VIA INFUSION
3000.0000 [IU] | Freq: Once | INTRAVENOUS | Status: AC
  Administered 2021-08-27: 3000 [IU] via INTRAVENOUS
  Filled 2021-08-27: qty 3000

## 2021-08-27 MED ORDER — IOHEXOL 350 MG/ML SOLN
60.0000 mL | Freq: Once | INTRAVENOUS | Status: AC | PRN
  Administered 2021-08-27: 60 mL via INTRAVENOUS

## 2021-08-27 MED ORDER — SODIUM CHLORIDE 0.9 % IV BOLUS
1000.0000 mL | Freq: Once | INTRAVENOUS | Status: AC
  Administered 2021-08-27: 1000 mL via INTRAVENOUS

## 2021-08-27 MED ORDER — DIVALPROEX SODIUM 125 MG PO DR TAB
125.0000 mg | DELAYED_RELEASE_TABLET | Freq: Every day | ORAL | Status: DC
  Administered 2021-08-27 – 2021-08-28 (×2): 125 mg via ORAL
  Filled 2021-08-27 (×2): qty 1

## 2021-08-27 MED ORDER — HEPARIN (PORCINE) 25000 UT/250ML-% IV SOLN
1100.0000 [IU]/h | INTRAVENOUS | Status: DC
  Administered 2021-08-27: 1100 [IU]/h via INTRAVENOUS
  Filled 2021-08-27: qty 250

## 2021-08-27 MED ORDER — SODIUM CHLORIDE 0.9 % IV SOLN: 100.0000 mg | Freq: Every day | INTRAVENOUS | Status: DC

## 2021-08-27 MED ORDER — ENSURE ENLIVE PO LIQD
237.0000 mL | Freq: Two times a day (BID) | ORAL | Status: DC
  Filled 2021-08-27: qty 237

## 2021-08-27 MED ORDER — APIXABAN 5 MG PO TABS
5.0000 mg | ORAL_TABLET | Freq: Two times a day (BID) | ORAL | Status: DC
Start: 2021-09-03 — End: 2021-08-28

## 2021-08-27 NOTE — ED Notes (Signed)
Pt daughter [name] called for update and would like further updates - number in chart

## 2021-08-27 NOTE — ED Notes (Signed)
SLP in room assessing pt

## 2021-08-27 NOTE — ED Notes (Signed)
Pt given a vanilla ensure beverage

## 2021-08-27 NOTE — Hospital Course (Signed)
Decadron, remdesivir, blood cultures, heparin

## 2021-08-27 NOTE — ED Notes (Signed)
Chux pad under pt changed, purwick placed.  Bear Hugger removed and pt at home quilt placed on pt.

## 2021-08-27 NOTE — H&P (Signed)
Date: 08/27/2021               Patient Name:  Megan Flores MRN: [id number]  DOB: 12-05-1934 Age / Sex: 86 y.o., female   PCP: Hortencia Pilar, MD         Medical Service: Internal Medicine Teaching Service         Attending Physician: Dr. Jimmye Norman, Elaina Pattee, MD    First Contact: Idamae Schuller MD Pager: Teodora Medici [phone]  Second Contact: Jose Persia, MD Pager:        After Hours (After 5p/  First Contact Pager: [phone]  weekends / holidays): Second Contact Pager: [phone]   SUBJECTIVE   Chief Complaint: Fall altered mental status  History of Present Illness: Patient altered, history obtained per chart review.  Patient recently admitted 9/17 for altered mental status thought to be septic but no source was found.  Patient has a history of bipolar, hypertension Presenting with fall after she was found down on the floor at her nursing home.  Last known normal was 6 PM on 10/4.  Patient was unable to go to dinner at that time because she was too weak and lethargic however she was able to have medicine administered.  Per medication administration approximately 3 hours before being found.  Waxing and waning mental status.  Some slurred speech but appears to be intermittent as well.  She does endorse some abdominal pain with palpation.   ED Course: Hypothermic lactate elevated 2.6 found to be COVID-positive.  While being worked up in the emergency department became hypoxic to mid 80s requiring requiring 2 L with hypotension and CT angio was ordered which showed segmental PE.  She was given remdesivir Decadron and heparin and admitted for further management.  Meds:  Current Meds  Medication Sig   Calcium Citrate-Vitamin D (CITRACAL + D PO) Take 1 tablet by mouth 2 (two) times daily.   clonazePAM (KLONOPIN) 0.5 MG disintegrating tablet Take 0.5 mg by mouth daily as needed for seizure.   clonazePAM (KLONOPIN) 0.5 MG tablet Take 0.5 mg by mouth 3 (three) times daily.   divalproex (DEPAKOTE) 125  MG DR tablet Take 125 mg by mouth daily in the afternoon.   divalproex (DEPAKOTE) 250 MG DR tablet Take 250 mg by mouth in the morning and at bedtime.   donepezil (ARICEPT) 5 MG tablet Take 5 mg by mouth at bedtime.   HYDROCHLOROTHIAZIDE PO Take 1 tablet by mouth daily.   losartan (COZAAR) 25 MG tablet Take 25 mg by mouth daily.   Melatonin 10 MG TABS Take 10 mg by mouth at bedtime.   NON FORMULARY Ted hose apply in the morning and remove at bedtime   QUEtiapine (SEROQUEL) 300 MG tablet Take 300 mg by mouth at bedtime.   simvastatin (ZOCOR) 20 MG tablet Take 20 mg by mouth at bedtime.    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[place])    Essential tremor 06/14/2013   HTN (hypertension)    Hypercholesteremia    Osteoarthritis    Peripheral neuropathy    Tremor     Past Surgical History:  Procedure Laterality Date   BREAST LUMPECTOMY Left 1998   pos   ORIF TIBIAL SHAFT FRACTURE W/ PLATES AND SCREWS Left    VAGINAL HYSTERECTOMY  Social: Unable to obtain Lives With: Skilled nursing facility Occupation: Support: Level of Function: PCP: Substances:  Family History: Unable to obtain  Allergies: Allergies as of 08/27/2021 - Review Complete 08/27/2021  Allergen Reaction Noted   Aspirin Other (See Comments) 05/25/2013   Levofloxacin Other (See Comments) 03/06/2014   Primidone Other (See Comments) 06/14/2013   Propranolol Anaphylaxis and Other (See Comments) 06/14/2013   Neomycin-bacitracin-polymyxin [bacitracin-neomycin-polymyxin] Rash 11/29/2014   Neosporin [neomycin-bacitracin zn-polymyx] Rash 06/14/2013   Polysporin [bacitracin-polymyxin b] Rash 06/14/2013   Topamax [topiramate] Other (See Comments) 05/01/2015    Review of Systems: A complete ROS was negative except as per HPI.   OBJECTIVE:   Physical Exam: Blood pressure  117/64, pulse 74, temperature (S) (!) 95.1 F (35.1 C), temperature source Rectal, resp. rate 11, height 5\' 4"  (1.626 m), weight 69.9 kg, SpO2 100 %.  Constitutional: Hypothermic altered mental status HENT: atraumatic, mucous membranes moist Eyes: Pinpoint Neck: supple Cardiovascular: regular rate and rhythm, no m/r/g Pulmonary/Chest: On 2 L nasal cannula, crackles bilaterally Abdominal: soft, tender to palpation, non-distended MSK: normal bulk and tone Neurological: Altered mental status and somnolent unable to assess neuro function Skin: warm and dry Psych: Unable to assess  Labs: CBC    Component Value Date/Time   WBC 8.1 08/27/2021 0205   RBC 4.11 08/27/2021 0205   HGB 11.7 (L) 08/27/2021 0205   HCT 35.3 (L) 08/27/2021 0205   PLT 229 08/27/2021 0205   MCV 85.9 08/27/2021 0205   MCH 28.5 08/27/2021 0205   MCHC 33.1 08/27/2021 0205   RDW 13.5 08/27/2021 0205   LYMPHSABS 1.4 08/27/2021 0205   MONOABS 1.3 (H) 08/27/2021 0205   EOSABS 0.1 08/27/2021 0205   BASOSABS 0.0 08/27/2021 0205     CMP     Component Value Date/Time   NA 129 (L) 08/27/2021 0205   K 3.2 (L) 08/27/2021 0205   CL 93 (L) 08/27/2021 0205   CO2 25 08/27/2021 0205   GLUCOSE 136 (H) 08/27/2021 0205   BUN 11 08/27/2021 0205   CREATININE 0.81 08/27/2021 0205   CALCIUM 8.7 (L) 08/27/2021 0205   PROT 5.8 (L) 08/27/2021 0205   ALBUMIN 3.1 (L) 08/27/2021 0205   AST 18 08/27/2021 0205   ALT 7 08/27/2021 0205   ALKPHOS 49 08/27/2021 0205   BILITOT 0.5 08/27/2021 0205   GFRNONAA >60 08/27/2021 0205    Imaging: CT HEAD WO CONTRAST (5MM)  Result Date: 08/27/2021 CLINICAL DATA:  86 year old female found down. EXAM: CT HEAD WITHOUT CONTRAST TECHNIQUE: Contiguous axial images were obtained from the base of the skull through the vertex without intravenous contrast. COMPARISON:  [facility] Brain MRI 02/06/2010. Head CT 08/09/2021, and earlier. FINDINGS: Brain: Stable cerebral volume. No  midline shift, ventriculomegaly, intracranial hemorrhage or evidence of cortically based acute infarction. There is a small chronic left tentorial meningioma, measuring about 15 mm and not significantly changed from a CT head with contrast 06/23/2013. Minimal mass effect on the left superior cerebellum (coronal image 56) with no edema. Furthermore, around intraventricular mass of the right lateral ventricle adjacent to the choroid plexus measures about 18-20 mm (coronal image 47). This was not apparent on the 2014 CT, and probably measured 10 mm on the 2011 MRI. No transependymal edema. Both of those lesions appear stable since 07/30/2021. No other intracranial mass lesion identified. No cerebral edema. No cortical encephalomalacia. Patchy bilateral white matter hypodensity remains mild for age. Vascular: Calcified atherosclerosis at the skull base. No suspicious intracranial vascular hyperdensity.  Skull: It a No acute osseous abnormality identified. Sinuses/Orbits: Visualized paranasal sinuses and mastoids are stable and well aerated. Other: No orbit or scalp soft tissue injury identified. IMPRESSION: 1. No acute intracranial abnormality or acute traumatic injury identified. 2. 1.5 to 2 cm round, chronic and indolent appearing masses of the right choroid plexus and the left tentorium are stable since last month and as before are not associated with cerebral or transependymal edema. The tentorial lesion is most compatible with meningioma. Electronically Signed   By: Genevie Ann M.D.   On: 08/27/2021 04:31   CT Angio Chest PE W and/or Wo Contrast  Addendum Date: 08/27/2021   ADDENDUM REPORT: 08/27/2021 04:42 ADDENDUM: Critical Value/emergent results were called by telephone at the time of interpretation on 08/27/2021 at 0424 hours to Dr. Shirlyn Goltz , who verbally acknowledged these results. Electronically Signed   By: Genevie Ann M.D.   On: 08/27/2021 04:42   Result Date: 08/27/2021 CLINICAL DATA:  86 year old female  found down. EXAM: CT ANGIOGRAPHY CHEST WITH CONTRAST TECHNIQUE: Multidetector CT imaging of the chest was performed using the standard protocol during bolus administration of intravenous contrast. Multiplanar CT image reconstructions and MIPs were obtained to evaluate the vascular anatomy. CONTRAST:  45mL OMNIPAQUE IOHEXOL 350 MG/ML SOLN COMPARISON:  Portable chest 0211 hours today. CT Abdomen and Pelvis 08/09/2021. FINDINGS: Cardiovascular: Good contrast bolus timing in the pulmonary arterial tree. The central and left lung pulmonary arteries are normally enhancing and appear to be patent. But there is focal low-density thrombus in a right upper lobe pulmonary segmental branch on series 6, image 126, also series 8, image 55. This appears at least partially occlusive at the branch bifurcation (series 6, image 129)., and a separate segmental branch is occluded on image 122. Furthermore, a small volume of segmental right middle lobe PE is visible (image 169). No right lower lobe or other segmental thrombus identified. Mild cardiomegaly. No right heart strain suspected. No pericardial effusion. Calcified aortic atherosclerosis. Little contrast in the aorta. Calcified coronary artery atherosclerosis. Mediastinum/Nodes: Negative. No mediastinal mass or lymphadenopathy. Lungs/Pleura: Major airways are patent. There is mild dependent and enhancing pulmonary atelectasis in both lungs. No pleural effusion, pulmonary infarct or consolidation. Mild subpleural lung scarring suspected such as in the left upper lobe series 7, image 45. Upper Abdomen: Negative visible liver, spleen, stomach. Musculoskeletal: No acute osseous abnormality identified. Mild for age thoracic spine degeneration. Review of the MIP images confirms the above findings. IMPRESSION: 1. Positive for acute pulmonary emboli involving a few segmental branches in the right upper and middle lobes. No central or saddle embolus. No pulmonary infarct or pleural  effusion. 2. Mild cardiomegaly. Calcified coronary artery and Aortic Atherosclerosis (ICD10-I70.0). Electronically Signed: By: Genevie Ann M.D. On: 08/27/2021 04:21   CT Cervical Spine Wo Contrast  Result Date: 08/27/2021 CLINICAL DATA:  86 year old female found down. EXAM: CT CERVICAL SPINE WITHOUT CONTRAST TECHNIQUE: Multidetector CT imaging of the cervical spine was performed without intravenous contrast. Multiplanar CT image reconstructions were also generated. COMPARISON:  Cervical spine CT 07/30/2021. Chest CTA today reported separately. FINDINGS: Alignment: Maintained cervical lordosis. Cervicothoracic junction alignment is within normal limits. Bilateral posterior element alignment is within normal limits. Skull base and vertebrae: Visualized skull base is intact. No atlanto-occipital dissociation. C1 and C2 appear intact and aligned. No acute osseous abnormality identified. Soft tissues and spinal canal: No intraspinal canal hematoma or prevertebral fluid or edema. Stable noncontrast neck soft tissues. Disc levels: Capacious spinal canal and mild for  age cervical spine degeneration. Upper chest: Small sclerotic focus at the head of the left 2nd rib appears stable and is most likely a benign bone island. Chest CTA reported separately today. IMPRESSION: No acute traumatic injury identified in the cervical spine, with mild for age cervical degeneration. Electronically Signed   By: Genevie Ann M.D.   On: 08/27/2021 04:35   DG Pelvis Portable  Result Date: 08/27/2021 CLINICAL DATA:  Recent fall with pelvic pain, initial encounter EXAM: PORTABLE PELVIS 1 VIEWS COMPARISON:  None. FINDINGS: Pelvic ring is intact. No acute fracture or dislocation is noted. No soft tissue abnormality is seen. IMPRESSION: No acute abnormality noted. Electronically Signed   By: Inez Catalina M.D.   On: 08/27/2021 02:23   DG Chest Port 1 View  Result Date: 08/27/2021 CLINICAL DATA:  Recent fall with chest pain, initial encounter  EXAM: PORTABLE CHEST 1 VIEW COMPARISON:  08/09/2021 FINDINGS: Cardiac shadow is stable. Aortic calcifications are again seen. Lungs are hypoinflated. Mild central vascular prominence is noted likely related to the poor inspiratory effort and crowding of vascular markings. No bony abnormality is seen. IMPRESSION: Crowding of the vascular markings related to a poor inspiratory effort. No other focal abnormality is seen. Electronically Signed   By: Inez Catalina M.D.   On: 08/27/2021 02:24    EKG: personally reviewed my interpretation is sinus rhythm no evidence of right heart strain on EKG   ASSESSMENT & PLAN:    Assessment & Plan by Problem: Active Problems:   Syncope and collapse   Megan Flores is a 86 y.o. with pertinent PMH of bipolar and hypertension who presented with syncope and collapse and admitted for altered mental status, hypoxia, pulmonary embolism on hospital day 0  #Syncope and collapse Altered mental status and syncope and collapse possibly secondary to hypoxia in the setting of pulmonary embolism and COVID CT head negative for acute intracranial abnormality or acute traumatic injury. CK negative for signs of rhabdomyolysis protecting airways.  N.p.o. - PT OT and SLP consulted - Neurochecks - Blood culture ordered we will need to follow-up     #Pulmonary embolism CT angio positive for acute PE involving the segmental branches in the right upper and middle lobes, no central or saddle emboli no pulmonary PESI score 243 class V high risk 30-day mortality the score may be elevated due to symptoms related to COVID infection as well.  No signs of right heart strain in V1 or lead 2 Hypoxia noted down into the 80s.  Now satting 90s on 2 L.  Blood pressure soft improved some after liter bolus Troponin negative - Echocardiogram to evaluate for right heart strain - Heparin per pharmacy  - Eliquis   #COVID Hypoxia - Dexamethasone 6 mg IV daily and remdesivir daily -  Flutter valve and incentive spirometry - Airborne and contact precautions - Hypothermic warming blanket  Hypokalemia 3.2 Replete as necessary  Bipolar 1 disorder Depakote oral  Hyperlipidemia  simvastatin  Diet: NPO VTE: Heparin IVF: None,None Code: Full  Prior to Admission Living Arrangement: SNF,   Anticipated Discharge Location: SNF Barriers to Discharge: pending medical stability  Dispo: Admit patient to Inpatient with expected length of stay greater than 2 midnights.  Signed: Delene Ruffini, MD Internal Medicine Resident PGY-1 Pager: [phone]  08/27/2021, 6:29 AM

## 2021-08-27 NOTE — ED Notes (Signed)
O2 sats decreased to 87%, pt placed on 2L [place] Sats up to 93%  Pt endorsing that she is feeling short of breath

## 2021-08-27 NOTE — ED Notes (Signed)
MD made aware of lactic level and pt BP

## 2021-08-27 NOTE — ED Notes (Signed)
Lunch ordered 

## 2021-08-27 NOTE — Progress Notes (Signed)
   Subjective:   This AM, Megan Flores recalls falling at her facility, [facility]. She endorses hitting the back of her head and is having some pain there now. She denies any SOB, abdominal pain, chest pain.    She is surprised to have COVID-19. She denies any recent cough, pleuritic chest pain.    On initial exam she is lethargic but on repeat exam patient is more alert and responsive.  Objective:  Vital signs in last 24 hours: Vitals:   08/27/21 0434 08/27/21 0439 08/27/21 0445 08/27/21 0515  BP: 117/64  100/61 (!) 99/58  Pulse: 74 74 74 77  Resp: 13 11 13 16   Temp:      TempSrc:      SpO2: 100% 100% 100% 99%  Weight:  69.9 kg    Height:  5\' 4"  (1.626 m)      Physical Exam General: Elderly lady in NAD, looks comfortable HENT: Dry mucous membranes, neck fullness present, no lymphadenopathy.  CV: regular rate and rhythm, no mrg Pulm: Breathing room air. Normal breath sounds.   Abd: Normal bowel sounds, Non-tender to palpation MSK: 2+ BLE, good strength in all extremities Neuro: Alert and oriented Skin: Warm and dry Psych: normal behavior and mood.  Assessment/Plan:  Active Problems:   Syncope and collapse  Acute hypoxic respiratory failure 2/2 to  Pulmonary Embolism vs COVID Syncope and collapse  Patient presented from nursing facility after passing out. She remembers hitting her head. Ddx included dehydration vs hypoxia vs seizure like activity. Patient was hypoxic and found to have a pulmonary embolism. She was also found to be COVID positive. In the ED she was given IV 4mg  Decadron and 1L saline was started. She was hypothermic and was placed on heating blanket. Patient appears more alert on repeat exam and states she wants to go home soon.    Plan: -Blood cultures pending -SLP eval: passed and placed on normal diet.  -Called [facility]: they will fax her MAR.    Hypokalemia  Potassium 3.2 Replete as necessary  Chronic Problems Bipolar 1  disorder Home Depakote oral  Hyperlipidemia  Home simvastatin   Diet: Normal VTE: Eliquis Code: Full Bowel: PRN  Prior to Admission Living Arrangement: Nursing facility Anticipated Discharge Location: Nursing facility Barriers to Discharge: Medical Workup Dispo: Anticipated discharge in approximately 1 day(s).

## 2021-08-27 NOTE — ED Notes (Signed)
Megan Flores daughter [phone] requesting to speak to the patient

## 2021-08-27 NOTE — ED Triage Notes (Signed)
Pt BIB EMS from [facility] Per EMS staff made rounds at midnight and found pt on floor leaned up on night stand. Unknown down time. According to pt she did not go to dinner around 6pm bc she could not get up. Staff tried to get pt up but pt too weak.  Pt is lethargic, weak, alert to verbal and able to answer questions, pinpoint pupils. Intermittent snoring resps.  VS with EMS  Initial BP 102/40 - 500 bolus - 118/60 HR 80 O2 98 RR 16 CBG 155 Per EMS pt had some slurred speech and possible facial droop

## 2021-08-27 NOTE — Evaluation (Signed)
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 PMH: recurrent falls, bipolar, HTN, peripheral neuropathy, essential tremor, breast CA, asthma, OA,    Assessment / Plan / Recommendation  Clinical Impression   Pt seen at bedside for assessment of swallow function and safety. Pt was awake and alert, pleasant and cooperative. Pt exhibits adequate oral motor strength and function. She has natural dentition. She reports no difficulty swallowing, but does report globus sensation, which raises concern for esophageal dysmotility. Pt accepted trials of ice chips, thin liquid, puree, and solid textures. Timely oral prep and clearing noted, with no overt s/s aspiration after any trial. Recommend continuing regular diet/thin liquids, although she may require assistance with self feeding due to upper extremity tremors. SLP will follow briefly to assess tolerance of regular consistencies and thin liquids.  SLP Visit Diagnosis: Dysphagia, unspecified (R13.10)    Aspiration Risk  Mild aspiration risk    Diet Recommendation Regular;Thin liquid   Liquid Administration via:  Straw;Cup Medication Administration: Whole meds with liquid Supervision: Patient able to self feed;Staff to assist with self feeding;Intermittent supervision to cue for compensatory strategies Compensations: Minimize environmental distractions;Slow rate;Small sips/bites Postural Changes: Seated upright at 90 degrees;Remain upright for at least 30 minutes after po intake    Other  Recommendations Oral Care Recommendations: Oral care BID    Recommendations for follow up therapy are one component of a multi-disciplinary discharge planning process, led by the attending physician.  Recommendations may be updated based on patient status, additional functional criteria and insurance authorization.  Follow up Recommendations None      Frequency and Duration min 1 x/week  1 week       Prognosis Prognosis for Safe Diet Advancement: Good      Swallow Study   General Date of Onset: 08/27/21 HPI: 86yo female admitted from [facility] 08/27/21 after fall and AMS. PMH: recurrent falls, bipolar, HTN, peripheral neuropathy, essential tremor, breast CA, asthma, OA, Type of Study: Bedside Swallow Evaluation Previous Swallow Assessment: none Diet Prior to this Study: Regular;Thin liquids Temperature Spikes Noted: No Respiratory Status: Room air History of Recent Intubation: No Behavior/Cognition: Alert;Cooperative;Pleasant mood Oral Cavity Assessment: Within Functional Limits Oral Care Completed by SLP: No Oral Cavity - Dentition: Adequate natural dentition Vision: Functional for self-feeding Self-Feeding Abilities: Able to feed self;Needs assist;Needs set up Patient Positioning: Upright in bed Baseline Vocal Quality: Normal Volitional Cough: Cognitively unable to elicit Volitional Swallow: Unable to elicit    Oral/Motor/Sensory Function Overall Oral Motor/Sensory Function: Within functional limits   Ice Chips Ice chips: Within functional limits Presentation:  Spoon   Thin Liquid Thin Liquid:  Within functional limits Presentation: Straw    Nectar Thick Nectar Thick Liquid: Not tested   Honey Thick Honey Thick Liquid: Not tested   Puree Puree: Within functional limits Presentation: Self Fed;Spoon   Solid     Solid: Within functional limits Presentation: [place] B. Quentin Ore, [facility], [place] Speech Language Pathologist Office: [phone]  Shonna Chock 08/27/2021,11:31 AM

## 2021-08-27 NOTE — ED Notes (Signed)
Daughter [name] updated on pt states and phone given to pt to take with her.  [name] phone number is updated in the demographics to be called when pt gets a bed upstairs

## 2021-08-27 NOTE — Progress Notes (Signed)
ANTICOAGULATION CONSULT NOTE - Initial Consult  Pharmacy Consult for heparin Indication: pulmonary embolus  Allergies  Allergen Reactions   Aspirin Other (See Comments)    ULCERS    Levofloxacin Other (See Comments)    Insomnia leading to mania (amoxicillin has always worked well for her)     Primidone Other (See Comments)    Dizziness    Propranolol Anaphylaxis and Other (See Comments)    Hypotension, also    Neomycin-Bacitracin-Polymyxin [Bacitracin-Neomycin-Polymyxin] Rash   Neosporin [Neomycin-Bacitracin Zn-Polymyx] Rash   Polysporin [Bacitracin-Polymyxin B] Rash   Topamax [Topiramate] Other (See Comments)    Reaction??    Patient Measurements: Height: 5\' 4"  (162.6 cm) Weight: 69.9 kg (154 lb) IBW/kg (Calculated) : 54.7  Vital Signs: Temp: 95.1 F (35.1 C) (10/05 0152) Temp Source: Rectal (10/05 0152) BP: 117/64 (10/05 0434) Pulse Rate: 74 (10/05 0439)  Labs: Recent Labs    08/27/21 0205 08/27/21 0351  HGB 11.7*  --   HCT 35.3*  --   PLT 229  --   CREATININE 0.81  --   CKTOTAL 107  --   TROPONINIHS 6 5    Estimated Creatinine Clearance: 50.5 mL/min (by C-G formula based on SCr of 0.81 mg/dL).   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  Goal of Therapy:  Heparin level 0.3-0.7 units/ml Monitor platelets by anticoagulation protocol: Yes   Plan:  Heparin 3000 units IV bolus x1 followed by infusion at 1100 units/hr. Monitor heparin  levels and CBC.  Wynona Neat, PharmD, BCPS  08/27/2021,4:40 AM

## 2021-08-27 NOTE — ED Notes (Signed)
Pt's daughter given update via phone °

## 2021-08-27 NOTE — ED Notes (Signed)
Pt given a snack 

## 2021-08-27 NOTE — ED Notes (Signed)
Admitting at bedside 

## 2021-08-27 NOTE — ED Provider Notes (Signed)
[facility] EMERGENCY DEPARTMENT Provider Note   CSN: [id number] Arrival date & time: 08/27/21  0138     History Chief Complaint  Patient presents with   Fall   AMS    Megan Flores is a 86 y.o. female history of bipolar, hypertension, here presenting with fall.  Patient is from nursing home.  Patient was found down on the floor.  Patient's last normal was around 6 PM.  Patient has waxing waning mental status per EMS.  She was noted to have slurred speech but sometimes more alert so no code stroke activated.  Patient was recently admitted for AMS and initially was thought to be septic but no source was found.  Patient was unable to give much history. EMS noticed that her BP is 102/40 and she was given 500 cc bolus.   The history is provided by the EMS personnel.  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[place])    Essential tremor 06/14/2013   HTN (hypertension)    Hypercholesteremia    Osteoarthritis    Peripheral neuropathy    Tremor     Patient Active Problem List   Diagnosis Date Noted   Altered mental status 08/09/2021   Personal history of other malignant neoplasm of skin 10/23/2020   Fasting hyperglycemia 12/02/2016   Frequent falls 07/20/2016   Advanced directives, counseling/discussion 11/06/2015   Chronic hyponatremia 09/13/2014   Chronic insomnia 09/13/2014   Catatonia 03/03/2014   Delirium due to multiple etiologies, acute, hyperactive 02/26/2014   Essential tremor 06/14/2013   Reactive airway disease 05/25/2013   Bipolar 1 disorder ([place]) 05/25/2013   Meningioma ([place]) 05/25/2013   Osteoarthritis 05/25/2013   Peripheral neuropathy 05/25/2013   Pure hypercholesterolemia 05/25/2013    Past Surgical History:  Procedure Laterality Date   BREAST LUMPECTOMY Left  1998   pos   ORIF TIBIAL SHAFT FRACTURE W/ PLATES AND SCREWS Left    VAGINAL HYSTERECTOMY       OB History   No obstetric history on file.     Family History  Problem Relation Age of Onset   Cancer Mother    Breast cancer Mother 77   Heart failure Father    Ovarian cancer Sister 51   Alzheimer's disease Brother     Social History   Tobacco Use   Smoking status: Never   Smokeless tobacco: Never  Vaping Use   Vaping Use: Never used  Substance Use Topics   Alcohol use: No   Drug use: No    Home Medications Prior to Admission medications   Medication Sig Start Date End Date Taking? Authorizing Provider  Calcium Citrate-Vitamin D (CITRACAL + D PO) Take 1 tablet by mouth 2 (two) times daily.   Yes [provider]  clonazePAM (KLONOPIN) 0.5 MG disintegrating tablet Take 0.5 mg by mouth daily as needed for seizure.   Yes [provider]  clonazePAM (KLONOPIN) 0.5 MG tablet Take 0.5 mg by mouth 3 (three) times daily.   Yes [provider]  divalproex (DEPAKOTE) 125 MG DR tablet Take 125 mg by mouth daily in the afternoon.   Yes [provider]  divalproex (DEPAKOTE) 250 MG DR tablet Take 250 mg by mouth in the morning and at bedtime.  Yes [provider]  donepezil (ARICEPT) 5 MG tablet Take 5 mg by mouth at bedtime.   Yes [provider]  HYDROCHLOROTHIAZIDE PO Take 1 tablet by mouth daily.   Yes [provider]  losartan (COZAAR) 25 MG tablet Take 25 mg by mouth daily. 12/18/20  Yes [provider]  Melatonin 10 MG TABS Take 10 mg by mouth at bedtime.   Yes [provider]  NON FORMULARY Ted hose apply in the morning and remove at bedtime   Yes [provider]  QUEtiapine (SEROQUEL) 300 MG tablet Take 300 mg by mouth at bedtime.   Yes [provider]  simvastatin (ZOCOR) 20 MG tablet Take 20 mg by mouth at bedtime.   Yes [provider]  clonazePAM (KLONOPIN) 0.5 MG  tablet Take 1 tablet (0.5 mg total) by mouth 2 (two) times daily for 7 days, THEN 0.5 tablets (0.25 mg total) 2 (two) times daily for 7 days, THEN 0.5 tablets (0.25 mg total) daily for 7 days. Patient not taking: Reported on 08/27/2021 08/11/21 09/01/21  Wayland Denis, MD    Allergies    Aspirin, Levofloxacin, Primidone, Propranolol, Neomycin-bacitracin-polymyxin [bacitracin-neomycin-polymyxin], Neosporin [neomycin-bacitracin zn-polymyx], Polysporin [bacitracin-polymyxin b], and Topamax [topiramate]  Review of Systems   Review of Systems  Neurological:  Positive for weakness.  All other systems reviewed and are negative.  Physical Exam Updated Vital Signs BP 117/64   Pulse 74   Temp (S) (!) 95.1 F (35.1 C) (Rectal) Comment: MD aware - bair hugger  Resp 11   Ht 5\' 4"  (1.626 m)   Wt 69.9 kg   SpO2 100%   BMI 26.43 kg/m   Physical Exam Vitals and nursing note reviewed.  Constitutional:      Comments: Confused   HENT:     Head:     Comments: + posterior scalp hematoma     Mouth/Throat:     Mouth: Mucous membranes are dry.  Eyes:     Extraocular Movements: Extraocular movements intact.     Pupils: Pupils are equal, round, and reactive to light.  Cardiovascular:     Rate and Rhythm: Normal rate and regular rhythm.     Pulses: Normal pulses.     Heart sounds: Normal heart sounds.  Pulmonary:     Effort: Pulmonary effort is normal.     Breath sounds: Normal breath sounds.  Abdominal:     General: Abdomen is flat.     Palpations: Abdomen is soft.  Musculoskeletal:        General: Normal range of motion.     Cervical back: Normal range of motion and neck supple.     Comments: No obvious midline tenderness, nl ROM bilaterally   Skin:    General: Skin is warm.     Capillary Refill: Capillary refill takes less than 2 seconds.  Neurological:     Comments: Confused, moving all extremities, difficulty following commands   Psychiatric:     Comments: Unable     ED Results /  Procedures / Treatments   Labs (all labs ordered are listed, but only abnormal results are displayed) Labs Reviewed  RESP PANEL BY RT-PCR (FLU A&B, COVID) ARPGX2 - Abnormal; Notable for the following components:      Result Value   SARS Coronavirus 2 by RT PCR POSITIVE (*)    All other components within normal limits  CBC WITH DIFFERENTIAL/PLATELET - Abnormal; Notable for the following components:   Hemoglobin 11.7 (*)    HCT  35.3 (*)    Monocytes Absolute 1.3 (*)    All other components within normal limits  COMPREHENSIVE METABOLIC PANEL - Abnormal; Notable for the following components:   Sodium 129 (*)    Potassium 3.2 (*)    Chloride 93 (*)    Glucose, Bld 136 (*)    Calcium 8.7 (*)    Total Protein 5.8 (*)    Albumin 3.1 (*)    All other components within normal limits  LACTIC ACID, PLASMA - Abnormal; Notable for the following components:   Lactic Acid, Venous 2.8 (*)    All other components within normal limits  LACTIC ACID, PLASMA - Abnormal; Notable for the following components:   Lactic Acid, Venous 2.6 (*)    All other components within normal limits  URINALYSIS, ROUTINE W REFLEX MICROSCOPIC - Abnormal; Notable for the following components:   Color, Urine STRAW (*)    Specific Gravity, Urine 1.004 (*)    All other components within normal limits  CULTURE, BLOOD (ROUTINE X 2)  CULTURE, BLOOD (ROUTINE X 2)  URINE CULTURE  CK  BRAIN NATRIURETIC PEPTIDE  VALPROIC ACID LEVEL  TROPONIN I (HIGH SENSITIVITY)  TROPONIN I (HIGH SENSITIVITY)    EKG EKG Interpretation  Date/Time:  Wednesday August 27 2021 01:55:51 EDT Ventricular Rate:  87 PR Interval:  205 QRS Duration: 93 QT Interval:  413 QTC Calculation: 497 R Axis:   -51 Text Interpretation: Sinus rhythm Left anterior fascicular block LVH with secondary repolarization abnormality Anterior Q waves, possibly due to LVH No significant change since last tracing Confirmed by Wandra Arthurs [phone]) on 08/27/2021 2:16:32  AM  Radiology CT Angio Chest PE W and/or Wo Contrast  Result Date: 08/27/2021 CLINICAL DATA:  86 year old female found down. EXAM: CT ANGIOGRAPHY CHEST WITH CONTRAST TECHNIQUE: Multidetector CT imaging of the chest was performed using the standard protocol during bolus administration of intravenous contrast. Multiplanar CT image reconstructions and MIPs were obtained to evaluate the vascular anatomy. CONTRAST:  42mL OMNIPAQUE IOHEXOL 350 MG/ML SOLN COMPARISON:  Portable chest 0211 hours today. CT Abdomen and Pelvis 08/09/2021. FINDINGS: Cardiovascular: Good contrast bolus timing in the pulmonary arterial tree. The central and left lung pulmonary arteries are normally enhancing and appear to be patent. But there is focal low-density thrombus in a right upper lobe pulmonary segmental branch on series 6, image 126, also series 8, image 55. This appears at least partially occlusive at the branch bifurcation (series 6, image 129)., and a separate segmental branch is occluded on image 122. Furthermore, a small volume of segmental right middle lobe PE is visible (image 169). No right lower lobe or other segmental thrombus identified. Mild cardiomegaly. No right heart strain suspected. No pericardial effusion. Calcified aortic atherosclerosis. Little contrast in the aorta. Calcified coronary artery atherosclerosis. Mediastinum/Nodes: Negative. No mediastinal mass or lymphadenopathy. Lungs/Pleura: Major airways are patent. There is mild dependent and enhancing pulmonary atelectasis in both lungs. No pleural effusion, pulmonary infarct or consolidation. Mild subpleural lung scarring suspected such as in the left upper lobe series 7, image 45. Upper Abdomen: Negative visible liver, spleen, stomach. Musculoskeletal: No acute osseous abnormality identified. Mild for age thoracic spine degeneration. Review of the MIP images confirms the above findings. IMPRESSION: 1. Positive for acute pulmonary emboli involving a few  segmental branches in the right upper and middle lobes. No central or saddle embolus. No pulmonary infarct or pleural effusion. 2. Mild cardiomegaly. Calcified coronary artery and Aortic Atherosclerosis (ICD10-I70.0). Electronically Signed   By: Lemmie Evens  [place] Crane M.D.   On: 08/27/2021 04:21   DG Pelvis Portable  Result Date: 08/27/2021 CLINICAL DATA:  Recent fall with pelvic pain, initial encounter EXAM: PORTABLE PELVIS 1 VIEWS COMPARISON:  None. FINDINGS: Pelvic ring is intact. No acute fracture or dislocation is noted. No soft tissue abnormality is seen. IMPRESSION: No acute abnormality noted. Electronically Signed   By: Inez Catalina M.D.   On: 08/27/2021 02:23   DG Chest Port 1 View  Result Date: 08/27/2021 CLINICAL DATA:  Recent fall with chest pain, initial encounter EXAM: PORTABLE CHEST 1 VIEW COMPARISON:  08/09/2021 FINDINGS: Cardiac shadow is stable. Aortic calcifications are again seen. Lungs are hypoinflated. Mild central vascular prominence is noted likely related to the poor inspiratory effort and crowding of vascular markings. No bony abnormality is seen. IMPRESSION: Crowding of the vascular markings related to a poor inspiratory effort. No other focal abnormality is seen. Electronically Signed   By: Inez Catalina M.D.   On: 08/27/2021 02:24    Procedures Procedures   CRITICAL CARE Performed by: Wandra Arthurs   Total critical care time: 30 minutes  Critical care time was exclusive of separately billable procedures and treating other patients.  Critical care was necessary to treat or prevent imminent or life-threatening deterioration.  Critical care was time spent personally by me on the following activities: development of treatment plan with patient and/or surrogate as well as nursing, discussions with consultants, evaluation of patient's response to treatment, examination of patient, obtaining history from patient or surrogate, ordering and performing treatments and interventions, ordering  and review of laboratory studies, ordering and review of radiographic studies, pulse oximetry and re-evaluation of patient's condition.   Medications Ordered in ED Medications  remdesivir 200 mg in sodium chloride 0.9% 250 mL IVPB (has no administration in time range)    Followed by  remdesivir 100 mg in sodium chloride 0.9 % 100 mL IVPB (has no administration in time range)  sodium chloride 0.9 % bolus 1,000 mL (0 mLs Intravenous Paused 08/27/21 0244)  dexamethasone (DECADRON) injection 4 mg (4 mg Intravenous Given 08/27/21 0436)  iohexol (OMNIPAQUE) 350 MG/ML injection 60 mL (60 mLs Intravenous Contrast Given 08/27/21 0414)    ED Course  I have reviewed the triage vital signs and the nursing notes.  Pertinent labs & imaging results that were available during my care of the patient were reviewed by me and considered in my medical decision making (see chart for details).    MDM Rules/Calculators/A&P                          Megan Flores is a 86 y.o. female here with unwitnessed fall. Was on the floor for several hours. Patient hypothermic. Will do sepsis workup with cbc, cmp, lactate, cultures, CXR, UA. Will also get CT head/neck to r/o bleed and fracture. Will get CK to r/o rhabdo.   4:46 AM Patient became hypoxic to mid 80s and required 2 L nasal cannula.  Patient also was hypotensive.  I ordered a CT angio to rule out PE.  CT did showed segmental PE.  Patient's urinalysis is clear and no obvious pneumonia.  Her lactic acidosis is likely secondary to COVID and hypoxia. I held antibiotics at this time as I do not think she is septic.  Patient given Decadron and remdesivir and heparin.  Internal medicine teaching service to admit    Final Clinical Impression(s) / ED Diagnoses Final diagnoses:  None  Rx / DC Orders ED Discharge Orders     None        Drenda Freeze, MD 08/27/21 [phone]

## 2021-08-28 DIAGNOSIS — J9601 Acute respiratory failure with hypoxia: Secondary | ICD-10-CM

## 2021-08-28 DIAGNOSIS — R55 Syncope and collapse: Secondary | ICD-10-CM

## 2021-08-28 LAB — COMPREHENSIVE METABOLIC PANEL
ALT: 10 U/L (ref 0–44)
AST: 19 U/L (ref 15–41)
Albumin: 3 g/dL — ABNORMAL LOW (ref 3.5–5.0)
Alkaline Phosphatase: 48 U/L (ref 38–126)
Anion gap: 10 (ref 5–15)
BUN: 12 mg/dL (ref 8–23)
CO2: 26 mmol/L (ref 22–32)
Calcium: 8.9 mg/dL (ref 8.9–10.3)
Chloride: 97 mmol/L — ABNORMAL LOW (ref 98–111)
Creatinine, Ser: 0.79 mg/dL (ref 0.44–1.00)
GFR, Estimated: >60 mL/min (ref >60)
Glucose, Bld: 187 mg/dL — ABNORMAL HIGH (ref 70–99)
Potassium: 3.7 mmol/L (ref 3.5–5.1)
Sodium: 133 mmol/L — ABNORMAL LOW (ref 135–145)
Total Bilirubin: 0.3 mg/dL (ref 0.3–1.2)
Total Protein: 6 g/dL — ABNORMAL LOW (ref 6.5–8.1)

## 2021-08-28 LAB — CBC WITH DIFFERENTIAL/PLATELET
Abs Immature Granulocytes: 0.04 10*3/uL (ref 0.00–0.07)
Basophils Absolute: 0 10*3/uL (ref 0.0–0.1)
Basophils Relative: 0 %
Eosinophils Absolute: 0 10*3/uL (ref 0.0–0.5)
Eosinophils Relative: 0 %
HCT: 37.4 % (ref 36.0–46.0)
Hemoglobin: 12 g/dL (ref 12.0–15.0)
Immature Granulocytes: 0 %
Lymphocytes Relative: 19 %
Lymphs Abs: 1.8 10*3/uL (ref 0.7–4.0)
MCH: 28.1 pg (ref 26.0–34.0)
MCHC: 32.1 g/dL (ref 30.0–36.0)
MCV: 87.6 fL (ref 80.0–100.0)
Monocytes Absolute: 0.7 10*3/uL (ref 0.1–1.0)
Monocytes Relative: 7 %
Neutro Abs: 7.1 10*3/uL (ref 1.7–7.7)
Neutrophils Relative %: 74 %
Platelets: 250 10*3/uL (ref 150–400)
RBC: 4.27 MIL/uL (ref 3.87–5.11)
RDW: 13.8 % (ref 11.5–15.5)
WBC: 9.6 10*3/uL (ref 4.0–10.5)
nRBC: 0 % (ref 0.0–0.2)

## 2021-08-28 LAB — CBG MONITORING, ED: Glucose-Capillary: 139 mg/dL — ABNORMAL HIGH (ref 70–99)

## 2021-08-28 LAB — D-DIMER, QUANTITATIVE: D-Dimer, Quant: 1.11 ug/mL-FEU — ABNORMAL HIGH (ref 0.00–0.50)

## 2021-08-28 LAB — FERRITIN: Ferritin: 44 ng/mL (ref 11–307)

## 2021-08-28 LAB — C-REACTIVE PROTEIN: CRP: 1.5 mg/dL — ABNORMAL HIGH (ref <1.0)

## 2021-08-28 MED ORDER — APIXABAN 5 MG PO TABS: ORAL_TABLET | ORAL | 0 refills | Status: DC

## 2021-08-28 NOTE — ED Notes (Addendum)
Pt given sandwich bag per request and soda/water. Pt denies any complaints at this time. Pt very animated when talking, discussing her teddy bear and herself in 3rd person. Pt wants to call her family, pt informed that it Is too early to call family. No acute changes noted. Will continue to monitor. Purewick remains in place, PIVs x2 without redness or edema. Call bell within reach, lights dimmed, side rails up x2.

## 2021-08-28 NOTE — ED Notes (Signed)
OT at bedside. 

## 2021-08-28 NOTE — ED Notes (Signed)
Ambulated with 1 assist to BR and back to bed, skin care done and patient place in her nightgown per request. Waiting on [facility] for transport.

## 2021-08-28 NOTE — Progress Notes (Signed)
CSW spoke with Reunion the Scientist, physiological at [facility]. CSW was told that patient will require a sitter if she returns to [facility]. Edwena Blow stated that the family would have to pay for a sitter and she believes patient will be upset about having a sitter and having to stay in her room. Edwena Blow stated they are not refusing to take patient back but when she gets back to their facility the family will need to arrange these services. Edwena Blow stated they do have a memory care unit but patient will need to have dementia diagnosis.

## 2021-08-28 NOTE — ED Notes (Signed)
Provider at bedside

## 2021-08-28 NOTE — ED Notes (Addendum)
Report given to Elliot Dally, RN of [phone]

## 2021-08-28 NOTE — Evaluation (Signed)
Occupational Therapy Evaluation Patient Details Name: Megan Flores MRN: [id number] DOB: Mar 04, 1935 Today's Date: 08/28/2021   History of Present Illness 86 year old female with with past medical history of bipolar disorder, hypertension, hyperlipidemia, idiopathic peripheral neuropathy and essential tremor who presented to ED from [place] for altered mental status, syncope and fall 10/5.  Covid-19 +.   Clinical Impression   Patient admitted for the diagnosis above.  PTA she lived at an ALF with assist as needed for meds, meals and showers.  Deficits impacting independence are listed below.  Currently she is needing up to [facility] for basic mobility and lower body ADL.  OT will follow in the acute setting, but SNF is recommended for short term post acute rehab to gain strength and improve balance for an eventual return to ALF.     Recommendations for follow up therapy are one component of a multi-disciplinary discharge planning process, led by the attending physician.  Recommendations may be updated based on patient status, additional functional criteria and insurance authorization.   Follow Up Recommendations  SNF for balance training and strength to eventually return to ALF.   Equipment Recommendations  None recommended by OT    Recommendations for Other Services Rehab consult     Precautions / Restrictions Precautions Precautions: Fall Restrictions Weight Bearing Restrictions: No      Mobility Bed Mobility Overal bed mobility: Needs Assistance Bed Mobility: Supine to Sit;Sit to Supine     Supine to sit: Min guard Sit to supine: Min guard     Patient Response: Administrator used: 4-wheeled walker Transfers: Sit to/from [employer] to Stand: Min assist Stand pivot transfers: Min assist       General transfer comment: HHA.  No RW in the room    Balance Overall balance assessment: Needs  assistance Sitting-balance support: No upper extremity supported;Feet supported Sitting balance-Leahy Scale: Fair     Standing balance support: Bilateral upper extremity supported Standing balance-Leahy Scale: Poor Standing balance comment: needs RW and external assist                           ADL either performed or assessed with clinical judgement   ADL Overall ADL's : Needs assistance/impaired Eating/Feeding: Set up;Sitting   Grooming: Min guard;Standing   Upper Body Bathing: Min guard;Sitting   Lower Body Bathing: Minimal assistance;Sitting/lateral leans   Upper Body Dressing : Set up;Sitting   Lower Body Dressing: Minimal assistance;Sitting/lateral leans;Sit to/from stand Lower Body Dressing Details (indicate cue type and reason): increased assist with brief Toilet Transfer: Minimal assistance;Ambulation   Toileting- Clothing Manipulation and Hygiene: Sitting/lateral lean;Min guard;Sit to/from stand       Functional mobility during ADLs: Minimal assistance General ADL Comments: HHA     Vision Baseline Vision/History: 1 Wears glasses Ability to See in Adequate Light: 0 Adequate Patient Visual Report: No change from baseline       Perception     Praxis      Pertinent Vitals/Pain Pain Assessment: No/denies pain     Hand Dominance Right   Extremity/Trunk Assessment Upper Extremity Assessment Upper Extremity Assessment: Overall WFL for tasks assessed   Lower Extremity Assessment Lower Extremity Assessment: Defer to PT evaluation   Cervical / Trunk Assessment Cervical / Trunk Assessment: Kyphotic   Communication Communication Communication: HOH   Cognition Arousal/Alertness: Awake/alert Behavior During Therapy: WFL for tasks assessed/performed Overall Cognitive Status: No family/caregiver present to determine  baseline cognitive functioning                   Orientation Level: Disoriented to;Time;Place Current Attention Level:  Sustained Memory: Decreased short-term memory Following Commands: Follows one step commands with increased time Safety/Judgement: Decreased awareness of deficits;Decreased awareness of safety   Problem Solving: Slow processing;Requires verbal cues;Requires tactile cues     General Comments       Exercises     Shoulder Instructions      Home Living Family/patient expects to be discharged to:: Assisted living                             Home Equipment: Cane - single point          Prior Functioning/Environment Level of Independence: Needs assistance  Gait / Transfers Assistance Needed: Pt amb with recent use of cane and with history of frequent falls. Pt poor historian and info from chart in addition to pt ADL's / Homemaking Assistance Needed: eats in dining hall, typically does own ADL bathing/dressing sink side in room.  Showers with staff assistance.            OT Problem List: Decreased activity tolerance;Impaired balance (sitting and/or standing);Decreased safety awareness;Decreased knowledge of use of DME or AE      OT Treatment/Interventions: Self-care/ADL training;Energy conservation;DME and/or AE instruction;Therapeutic activities;Patient/family education;Balance training    OT Goals(Current goals can be found in the care plan section) Acute Rehab OT Goals Patient Stated Goal: I need to get stronger so I can home. OT Goal Formulation: With patient Time For Goal Achievement: 09/11/21 Potential to Achieve Goals: Good ADL Goals Pt Will Perform Grooming: with supervision;sitting;standing Pt Will Perform Lower Body Bathing: with supervision;sit to/from stand Pt Will Perform Lower Body Dressing: with supervision;sit to/from stand Pt Will Transfer to Toilet: with supervision;ambulating;regular height toilet Pt Will Perform Toileting - Clothing Manipulation and hygiene: with supervision;sitting/lateral leans;sit to/from stand  OT Frequency: Min 2X/week    Barriers to D/C:    none noted       Co-evaluation              AM-PAC OT "6 Clicks" Daily Activity     Outcome Measure Help from another person eating meals?: None Help from another person taking care of personal grooming?: A Little Help from another person toileting, which includes using toliet, bedpan, or urinal?: A Little Help from another person bathing (including washing, rinsing, drying)?: A Little Help from another person to put on and taking off regular upper body clothing?: A Little Help from another person to put on and taking off regular lower body clothing?: A Little 6 Click Score: 19   End of Session Equipment Utilized During Treatment: Gait belt Nurse Communication: Mobility status  Activity Tolerance: Patient tolerated treatment well Patient left: in bed;with call bell/phone within reach  OT Visit Diagnosis: Other abnormalities of gait and mobility (R26.89);History of falling (Z91.81);Muscle weakness (generalized) (M62.81);Other symptoms and signs involving cognitive function                Time: 6759-1638 OT Time Calculation (min): 17 min Charges:  OT General Charges $OT Visit: 1 Visit OT Evaluation $OT Eval Moderate Complexity: 1 Mod  08/28/2021  RP, OTR/L  Acute Rehabilitation Services  Office:  [place] 08/28/2021, 2:35 PM

## 2021-08-28 NOTE — ED Notes (Signed)
Patient refuses to have new IV placed.

## 2021-08-28 NOTE — Discharge Summary (Signed)
Name: Megan Flores MRN: [id number] DOB: 07-22-1935 86 y.o. PCP: Hortencia Pilar, MD  Date of Admission: 08/27/2021  1:38 AM Date of Discharge: 08/28/2021  5:30 PM Attending Physician: No att. providers found  Discharge Diagnosis: 1. Acute hypoxic respiratory failure 2/2 Covid infection and pulmonary embolism 2. Hypokalemia 3. Bipolar Disorder 1 4. Hyperlipidemia  Discharge Medications: Allergies as of 08/28/2021       Reactions   Aspirin Other (See Comments)   ULCERS   Levofloxacin Other (See Comments)   Insomnia leading to mania (amoxicillin has always worked well for her)   Primidone Other (See Comments)   Dizziness   Propranolol Anaphylaxis, Other (See Comments)   Hypotension, also   Neomycin-bacitracin-polymyxin [bacitracin-neomycin-polymyxin] Rash   Neosporin [neomycin-bacitracin Zn-polymyx] Rash   Polysporin [bacitracin-polymyxin B] Rash   Topamax [topiramate] Other (See Comments)   Reaction??        Medication List     TAKE these medications    apixaban 5 MG Tabs tablet Commonly known as: ELIQUIS Take 2 tablets (10 mg total) by mouth 2 (two) times daily for 6 days, THEN 1 tablet (5 mg total) 2 (two) times daily. Start taking on: August 28, 2021   CITRACAL + D PO Take 1 tablet by mouth 2 (two) times daily.   clonazePAM 0.5 MG tablet Commonly known as: KLONOPIN Take 0.5 mg by mouth 3 (three) times daily.   clonazePAM 0.5 MG disintegrating tablet Commonly known as: KLONOPIN Take 0.5 mg by mouth daily as needed for seizure.   clonazePAM 0.5 MG tablet Commonly known as: KLONOPIN Take 1 tablet (0.5 mg total) by mouth 2 (two) times daily for 7 days, THEN 0.5 tablets (0.25 mg total) 2 (two) times daily for 7 days, THEN 0.5 tablets (0.25 mg total) daily for 7 days. Start taking on: August 11, 2021   divalproex 250 MG DR tablet Commonly known as: DEPAKOTE Take 250 mg by mouth in the morning and at bedtime. What changed: Another medication with  the same name was removed. Continue taking this medication, and follow the directions you see here.   donepezil 5 MG tablet Commonly known as: ARICEPT Take 5 mg by mouth at bedtime.   HYDROCHLOROTHIAZIDE PO Take 1 tablet by mouth daily.   losartan 25 MG tablet Commonly known as: COZAAR Take 25 mg by mouth daily.   Melatonin 10 MG Tabs Take 10 mg by mouth at bedtime.   NON FORMULARY Ted hose apply in the morning and remove at bedtime   QUEtiapine 300 MG tablet Commonly known as: SEROQUEL Take 300 mg by mouth at bedtime.   simvastatin 20 MG tablet Commonly known as: ZOCOR Take 20 mg by mouth at bedtime.        Disposition and follow-up:   Ms.Megan Flores was discharged from [facility] in Stable condition.  At the hospital follow up visit please address:  1. Acute hypoxic respiratory failure 2/2 Covid infection and pulmonary embolism 2. Hypokalemia 3. Bipolar Disorder 1 4. Hyperlipidemia  2.  Labs / imaging needed at time of follow-up: CBC, CMP  3.  Pending labs/ test needing follow-up: none   Hospital Course by problem list: Acute hypoxic respiratory failure 2/2 to  Pulmonary Embolism vs COVID Syncope and collapse   Patient presented from nursing facility after passing out. She remembers hitting her head. CT negative for any bleed. Patient was hypoxic and found to have a pulmonary embolism. She was also found to be COVID positive. IV heparin  was started. The heparin was transitioned to an Eliquis. She was discharged on Eliquis. In the ED she was given IV 4mg  Decadron and 1L saline was started. Remdesivir was given in the ED as well. She was hypothermic and was placed on heating blanket. Patient appears more alert on repeat exam and states she wants to go home soon. She continued to improve and did not have any hypotensive or hypothermic episode. Blood cultures were NGTD. Next morning, she remained stable and discharged with daughter given update  regarding her.        Hypokalemia  Potassium 3.2 initally repleted and repeat showed 3.7 at discharge.    Bipolar 1 disorder Home Depakote oral was given during her inpatient stay.  Hyperlipidemia  Home simvastatin  was given during her inpatient stay  Discharge Subjective: Denied any acute concerns.  Discharge Exam:   BP (!) 147/82   Pulse 89   Temp 98.2 F (36.8 C) (Oral)   Resp 18   Ht 5\' 4"  (1.626 m)   Wt 69.9 kg   SpO2 97%   BMI 26.43 kg/m  Physical Exam General: NAD, looks comfortable HENT:Mucus membranes moist, PERL Cardiovascular: normal heart sounds, normal pulses Pulmonary: normal work of breathing, CTAB Abdominal: Non TTP MSK: 5/5 strength in all extremities. No edema present.  Skin: redness around the right eye but states here eyes were teary and using bed sheet to wipe off. Tissues given. Neuro: alert and oriented x4 Psych: normal mood. Labile affect Pertinent Labs, Studies, and Procedures:  CBC Latest Ref Rng & Units 08/28/2021 08/27/2021 08/11/2021  WBC 4.0 - 10.5 K/uL 9.6 8.1 7.0  Hemoglobin 12.0 - 15.0 g/dL 12.0 11.7(L) 12.4  Hematocrit 36.0 - 46.0 % 37.4 35.3(L) 37.0  Platelets 150 - 400 K/uL 250 229 222   CMP Latest Ref Rng & Units 08/28/2021 08/27/2021 08/11/2021  Glucose 70 - 99 mg/dL 187(H) 136(H) 93  BUN 8 - 23 mg/dL 12 11 16   Creatinine 0.44 - 1.00 mg/dL 0.79 0.81 0.76  Sodium 135 - 145 mmol/L 133(L) 129(L) 129(L)  Potassium 3.5 - 5.1 mmol/L 3.7 3.2(L) 3.5  Chloride 98 - 111 mmol/L 97(L) 93(L) 94(L)  CO2 22 - 32 mmol/L 26 25 24   Calcium 8.9 - 10.3 mg/dL 8.9 8.7(L) 8.3(L)  Total Protein 6.5 - 8.1 g/dL 6.0(L) 5.8(L) 5.6(L)  Total Bilirubin 0.3 - 1.2 mg/dL 0.3 0.5 0.7  Alkaline Phos 38 - 126 U/L 48 49 39  AST 15 - 41 U/L 19 18 17   ALT 0 - 44 U/L 10 7 12   BNP wnl CK wnl Blood cultures NGTD Troponin negative Urinalysis: no nitrites or LE Respiratory panel: COVID postive   CT HEAD WO CONTRAST (5MM)  Result Date: 08/27/2021 CLINICAL DATA:   86 year old female found down. EXAM: CT HEAD WITHOUT CONTRAST TECHNIQUE: IMPRESSION: 1. No acute intracranial abnormality or acute traumatic injury identified. 2. 1.5 to 2 cm round, chronic and indolent appearing masses of the right choroid plexus and the left tentorium are stable since last month and as before are not associated with cerebral or transependymal edema. The tentorial lesion is most compatible with meningioma. Electronically Signed   By: Genevie Ann M.D.   On: 08/27/2021 04:31   CT Angio Chest PE W and/or Wo Contrast  Addendum Date: 08/27/2021   ADDENDUM REPORT: 08/27/2021 04:42 ADDENDUM: Critical Value/emergent results were called by telephone at the time of interpretation on 08/27/2021 at 0424 hours to Dr. Shirlyn Goltz , who verbally acknowledged these results. Electronically  Signed   By: Genevie Ann M.D.   On: 08/27/2021 04:42   Result Date: 08/27/2021 CLINICAL DATA:  86 year old female found down. EXAM: CT ANGIOGRAPHY CHEST WITH CONTRAST TECHNIQUE: IMPRESSION: 1. Positive for acute pulmonary emboli involving a few segmental branches in the right upper and middle lobes. No central or saddle embolus. No pulmonary infarct or pleural effusion. 2. Mild cardiomegaly. Calcified coronary artery and Aortic Atherosclerosis (ICD10-I70.0). Electronically Signed: By: Genevie Ann M.D. On: 08/27/2021 04:21   CT Cervical Spine Wo Contrast  Result Date: 08/27/2021 CLINICAL DATA:  86 year old female found down. EXAM: CT CERVICAL SPINE WITHOUT CONTRAST TECHNIQUE: IMPRESSION: No acute traumatic injury identified in the cervical spine, with mild for age cervical degeneration. Electronically Signed   By: Genevie Ann M.D.   On: 08/27/2021 04:35   DG Pelvis Portable  Result Date: 08/27/2021 CLINICAL DATA:  Recent fall with pelvic pain, initial encounter EXAM: PORTABLE PELVIS 1 VIEWS COMPARISON:  None.  IMPRESSION: No acute abnormality noted. Electronically Signed   By: Inez Catalina M.D.   On: 08/27/2021 02:23   DG Chest Port  1 View  Result Date: 08/27/2021 CLINICAL DATA:  Recent fall with chest pain, initial encounter EXAM: PORTABLE CHEST 1 VIEW COMPARISON:  08/09/2021 IMPRESSION: Crowding of the vascular markings related to a poor inspiratory effort. No other focal abnormality is seen. Electronically Signed   By: Inez Catalina M.D.   On: 08/27/2021 02:24    Discharge Instructions: Discharge Instructions     Call MD for:  difficulty breathing, headache or visual disturbances   Complete by: As directed    Call MD for:  persistant dizziness or light-headedness   Complete by: As directed    Call MD for:  persistant nausea and vomiting   Complete by: As directed    Call MD for:  severe uncontrolled pain   Complete by: As directed    Call MD for:  temperature >100.4   Complete by: As directed    Diet - low sodium heart healthy   Complete by: As directed    Discharge instructions   Complete by: As directed    Mrs. Finchum,   You were admitted to the hospital after a fall. During evaluation, we discovered a blood clot in your lungs. This is likely from COVID-19 infection, that was also diagnosed when you arrived.   Instructions:  - Please start Eliquis 5 mg (Take 2 tablets twice daily for the next 6 days and then decrease to 1 tablet twice daily) - Quarantine for 10 days (until September 06, 2021)   It was a pleasure meeting you and we wish you the best! Dr. Charleen Kirks   Increase activity slowly   Complete by: As directed        Signed: Idamae Schuller, MD Tillie Rung. [facility] Internal Medicine Residency, PGY-1  08/28/2021, 7:37 PM   Pager: [phone]

## 2021-08-28 NOTE — ED Notes (Signed)
Patient called for bathroom assistance, this tech went in room and soiled brief was on the floor along with linens, and ecg leads. Patient refused new brief and stated " I want a beige brief if I cant go to the toilet, and Im not going on that bedpan." This tech offered a new brief or chuck and patient refused. This tech offered to cover patient up and asked if anything else was needed patient stated " cover me up and leave me the hell alone, you can't do anything for me anyway."

## 2021-08-28 NOTE — ED Notes (Signed)
Med-Surg

## 2021-08-28 NOTE — ED Notes (Addendum)
Pt called out stating that her arm was bleeding. Upon entering room, pt had removed IV in L wrist due to discomfort and was holding Kleenex over the IV site. Site dressed using gauze and wrap. Pt stated that she had to use the bathroom, pt adamant on ambulating to the bathroom. Pt given options for urinating, the pt declined all and urinated in brief. Pt's brief changed and perineal care performed. Pt repositioned in bed.

## 2021-08-28 NOTE — ED Notes (Signed)
PTAR called at 15:36; second in line

## 2021-08-28 NOTE — Progress Notes (Signed)
CSW received a call from patient daughter, Megan Flores. [name] stated that she was seeing if patient could go to a nursing facility or if something else could be done due to [facility] requiring a sitter when she returns. CSW stated that PT/OT will be evaluating her mother and will make recommendations. CSW stated they could recommend skilled nursing but the problem is that CSW is not aware of any SNF's currently taking any covid positive patients. CSW explained to the daughter that patient could not board in the ED until she is out of her covid days. Patients daughter stated she understands. CSW stated she could go back to [facility] and then transition to SNF from there. CSW stated her mother does not have medicaid so she would not be able to stay at a SNF long term without that. CSW explained she would probably only be there for 21-30 days. Patients daughter stated she does not believe her father is going to want to pay for a sitter at [facility]. Patients daughter stated that her father might just pick her mother up from the hospital instead of taking her to [place]. Patients daughter also brought up memory care and CSW explained her mother would have to have a dementia diagnosis to be in that unit. Patients daughter asked if that could be done in the hospital. CSW stated she is not aware of the ED being able to do that. CSW suggested she goes through patients PCP or a neurologist who could make that determination. Patients daughter asked to be contacted prior to discharge so it will give patients husband some time to drive to the hospital from [place].

## 2021-09-01 LAB — CULTURE, BLOOD (ROUTINE X 2)
Culture: NO GROWTH
Culture: NO GROWTH
Special Requests: ADEQUATE
Special Requests: ADEQUATE

## 2021-12-30 ENCOUNTER — Emergency Department (HOSPITAL_COMMUNITY): Payer: Medicare Other

## 2021-12-30 ENCOUNTER — Other Ambulatory Visit: Payer: Self-pay

## 2021-12-30 ENCOUNTER — Encounter (HOSPITAL_COMMUNITY): Payer: Self-pay | Admitting: Emergency Medicine

## 2021-12-30 ENCOUNTER — Emergency Department (HOSPITAL_COMMUNITY)
Admission: EM | Admit: 2021-12-30 | Discharge: 2021-12-30 | Disposition: A | Discharge status: Home / Self Care | Payer: Medicare Other | Attending: Emergency Medicine | Admitting: Emergency Medicine

## 2021-12-30 DIAGNOSIS — Z20822 Contact with and (suspected) exposure to covid-19: Secondary | ICD-10-CM | POA: Insufficient documentation

## 2021-12-30 DIAGNOSIS — S0990XA Unspecified injury of head, initial encounter: Secondary | ICD-10-CM | POA: Insufficient documentation

## 2021-12-30 DIAGNOSIS — W01198A Fall on same level from slipping, tripping and stumbling with subsequent striking against other object, initial encounter: Secondary | ICD-10-CM | POA: Insufficient documentation

## 2021-12-30 DIAGNOSIS — Z7901 Long term (current) use of anticoagulants: Secondary | ICD-10-CM | POA: Insufficient documentation

## 2021-12-30 DIAGNOSIS — T1490XA Injury, unspecified, initial encounter: Secondary | ICD-10-CM

## 2021-12-30 DIAGNOSIS — I10 Essential (primary) hypertension: Secondary | ICD-10-CM | POA: Insufficient documentation

## 2021-12-30 DIAGNOSIS — M542 Cervicalgia: Secondary | ICD-10-CM | POA: Insufficient documentation

## 2021-12-30 DIAGNOSIS — W19XXXA Unspecified fall, initial encounter: Secondary | ICD-10-CM

## 2021-12-30 LAB — RESP PANEL BY RT-PCR (FLU A&B, COVID) ARPGX2
Influenza A by PCR: NEGATIVE
Influenza B by PCR: NEGATIVE
SARS Coronavirus 2 by RT PCR: NEGATIVE

## 2021-12-30 LAB — CBC
HCT: 46.2 % — ABNORMAL HIGH (ref 36.0–46.0)
Hemoglobin: 15.3 g/dL — ABNORMAL HIGH (ref 12.0–15.0)
MCH: 29.3 pg (ref 26.0–34.0)
MCHC: 33.1 g/dL (ref 30.0–36.0)
MCV: 88.5 fL (ref 80.0–100.0)
Platelets: 223 10*3/uL (ref 150–400)
RBC: 5.22 MIL/uL — ABNORMAL HIGH (ref 3.87–5.11)
RDW: 13.3 % (ref 11.5–15.5)
WBC: 9.7 10*3/uL (ref 4.0–10.5)
nRBC: 0 % (ref 0.0–0.2)

## 2021-12-30 LAB — SAMPLE TO BLOOD BANK

## 2021-12-30 LAB — ETHANOL: Alcohol, Ethyl (B): <10 mg/dL (ref <10)

## 2021-12-30 LAB — COMPREHENSIVE METABOLIC PANEL
ALT: 16 U/L (ref 0–44)
AST: 28 U/L (ref 15–41)
Albumin: 4.5 g/dL (ref 3.5–5.0)
Alkaline Phosphatase: 54 U/L (ref 38–126)
Anion gap: 15 (ref 5–15)
BUN: 15 mg/dL (ref 8–23)
CO2: 28 mmol/L (ref 22–32)
Calcium: 10.1 mg/dL (ref 8.9–10.3)
Chloride: 95 mmol/L — ABNORMAL LOW (ref 98–111)
Creatinine, Ser: 0.86 mg/dL (ref 0.44–1.00)
GFR, Estimated: >60 mL/min (ref >60)
Glucose, Bld: 117 mg/dL — ABNORMAL HIGH (ref 70–99)
Potassium: 3.6 mmol/L (ref 3.5–5.1)
Sodium: 138 mmol/L (ref 135–145)
Total Bilirubin: 0.6 mg/dL (ref 0.3–1.2)
Total Protein: 8.1 g/dL (ref 6.5–8.1)

## 2021-12-30 LAB — I-STAT CHEM 8, ED
BUN: 16 mg/dL (ref 8–23)
BUN: 18 mg/dL (ref 8–23)
Calcium, Ion: 1.13 mmol/L — ABNORMAL LOW (ref 1.15–1.40)
Calcium, Ion: 1.12 mmol/L — ABNORMAL LOW (ref 1.15–1.40)
Chloride: 97 mmol/L — ABNORMAL LOW (ref 98–111)
Chloride: 98 mmol/L (ref 98–111)
Creatinine, Ser: 0.8 mg/dL (ref 0.44–1.00)
Creatinine, Ser: 0.8 mg/dL (ref 0.44–1.00)
Glucose, Bld: 112 mg/dL — ABNORMAL HIGH (ref 70–99)
Glucose, Bld: 108 mg/dL — ABNORMAL HIGH (ref 70–99)
HCT: 48 % — ABNORMAL HIGH (ref 36.0–46.0)
HCT: 48 % — ABNORMAL HIGH (ref 36.0–46.0)
Hemoglobin: 16.3 g/dL — ABNORMAL HIGH (ref 12.0–15.0)
Hemoglobin: 16.3 g/dL — ABNORMAL HIGH (ref 12.0–15.0)
Potassium: 3.5 mmol/L (ref 3.5–5.1)
Potassium: 3.6 mmol/L (ref 3.5–5.1)
Sodium: 137 mmol/L (ref 135–145)
Sodium: 138 mmol/L (ref 135–145)
TCO2: 29 mmol/L (ref 22–32)
TCO2: 32 mmol/L (ref 22–32)

## 2021-12-30 LAB — CK TOTAL AND CKMB (NOT AT ARMC)
CK, MB: 15 ng/mL — ABNORMAL HIGH (ref 0.5–5.0)
Relative Index: 5.2 — ABNORMAL HIGH (ref 0.0–2.5)
Total CK: 291 U/L — ABNORMAL HIGH (ref 38–234)

## 2021-12-30 LAB — PROTIME-INR
INR: 1 (ref 0.8–1.2)
Prothrombin Time: 13.4 seconds (ref 11.4–15.2)

## 2021-12-30 LAB — LACTIC ACID, PLASMA: Lactic Acid, Venous: 2.3 mmol/L (ref 0.5–1.9)

## 2021-12-30 NOTE — Discharge Instructions (Signed)
You have been seen and discharged from the emergency department.  Your blood work showed dehydration, it is very important that you drink lots of fluids over the next couple days.  In regards to the generalized swelling in your lower extremities you may use compression stockings, elevate the legs when possible to decrease swelling.  CT imaging of the head and neck were unremarkable.  There were mild compression fractures at T1 and an old one at L2.  Treatment for this is pain controlled and activity as tolerated.  Follow-up with your primary provider for further evaluation and further care. Take home medications as prescribed. If you have any worsening symptoms or further concerns for your health please return to an emergency department for further evaluation.

## 2021-12-30 NOTE — ED Provider Notes (Signed)
[facility] EMERGENCY DEPARTMENT Provider Note   CSN: [id number] Arrival date & time: 12/30/21  3810     History  No chief complaint on file.   Megan Flores is a 87 y.o. female.  HPI  87 year old female with past medical history of Alzheimer's, HTN, HLD, anticoagulated on Eliquis presents the emergency department as a level 2 trauma for fall with head injury on thinners.  Patient is from an assisted living facility.  Level 5 caveat due to baseline memory issues.  Patient does recall getting up in the middle of the night to use the restroom when she had a mechanical fall.  She believes she fell back onto her back, possible head injury.  She does not believe that she lost consciousness.  No syncope or seizure-like activity.  Patient remained on the floor overnight until staff found her this morning, laying supine.  Patient had complained of neck pain to the facility staff but denies any current complaints to me.  No other recent reported illness.  Last dose of Eliquis was last night.  Home Medications Prior to Admission medications   Medication Sig Start Date End Date Taking? Authorizing Provider  apixaban (ELIQUIS) 5 MG TABS tablet Take 2 tablets (10 mg total) by mouth 2 (two) times daily for 6 days, THEN 1 tablet (5 mg total) 2 (two) times daily. Patient taking differently: 5mg  twice daily 08/28/21 02/28/22 Yes Jose Persia, MD  Calcium Citrate-Vitamin D (CITRACAL + D PO) Take 1 tablet by mouth 2 (two) times daily.   Yes [provider]  clonazePAM (KLONOPIN) 0.5 MG disintegrating tablet Take 0.5 mg by mouth daily as needed for seizure. Also has Clonazepam 0.5mg  scheduled   Yes [provider]  clonazePAM (KLONOPIN) 0.5 MG tablet Take 0.5 mg by mouth 3 (three) times daily. Also takes Clonazepam 0.5mg  ODT for as needed use   Yes [provider]  divalproex (DEPAKOTE) 250 MG DR tablet Take 250 mg by mouth 3 (three) times daily.   Yes  [provider]  donepezil (ARICEPT) 5 MG tablet Take 5 mg by mouth at bedtime.   Yes [provider]  hydrochlorothiazide (HYDRODIURIL) 25 MG tablet Take 25 mg by mouth daily.   Yes [provider]  losartan (COZAAR) 25 MG tablet Take 25 mg by mouth daily. 12/18/20  Yes [provider]  Melatonin 10 MG TABS Take 10 mg by mouth at bedtime.   Yes [provider]  QUEtiapine (SEROQUEL) 300 MG tablet Take 300 mg by mouth at bedtime.   Yes [provider]  simvastatin (ZOCOR) 20 MG tablet Take 20 mg by mouth at bedtime.   Yes [provider]      Allergies    Aspirin, Levofloxacin, Primidone, Propranolol, Neomycin-bacitracin-polymyxin [bacitracin-neomycin-polymyxin], and Topamax [topiramate]    Review of Systems   Review of Systems  Constitutional:  Negative for fever.  Respiratory:  Negative for shortness of breath.   Cardiovascular:  Negative for chest pain.  Gastrointestinal:  Negative for abdominal pain.  Musculoskeletal:  Negative for back pain and neck pain.  Skin:  Negative for wound.  Neurological:  Negative for headaches.   Physical Exam Updated Vital Signs BP 137/70    Pulse 79    Temp (!) 96.3 F (35.7 C) (Axillary)    Resp (!) 9    Ht 5\' 4"  (1.626 m)    Wt 68 kg    SpO2 100%    BMI 25.75 kg/m  Physical Exam  Vitals and nursing note reviewed.  Constitutional:      Appearance: Normal appearance.  HENT:     Head: Normocephalic and atraumatic.     Mouth/Throat:     Mouth: Mucous membranes are moist.  Eyes:     Pupils: Pupils are equal, round, and reactive to light.  Neck:     Comments: C-collar in place, no midline cervical spine tenderness to palpation Cardiovascular:     Rate and Rhythm: Normal rate.  Pulmonary:     Effort: Pulmonary effort is normal. No respiratory distress.  Abdominal:     Palpations: Abdomen is soft.     Tenderness: There is no abdominal tenderness.  Musculoskeletal:        General:  No swelling or deformity.  Skin:    General: Skin is warm.  Neurological:     Mental Status: She is alert and oriented to person, place, and time. Mental status is at baseline.  Psychiatric:        Mood and Affect: Mood normal.    ED Results / Procedures / Treatments   Labs (all labs ordered are listed, but only abnormal results are displayed) Labs Reviewed  CBC - Abnormal; Notable for the following components:      Result Value   RBC 5.22 (*)    Hemoglobin 15.3 (*)    HCT 46.2 (*)    All other components within normal limits  I-STAT CHEM 8, ED - Abnormal; Notable for the following components:   Chloride 97 (*)    Glucose, Bld 112 (*)    Calcium, Ion 1.13 (*)    Hemoglobin 16.3 (*)    HCT 48.0 (*)    All other components within normal limits  RESP PANEL BY RT-PCR (FLU A&B, COVID) ARPGX2  COMPREHENSIVE METABOLIC PANEL  ETHANOL  URINALYSIS, ROUTINE W REFLEX MICROSCOPIC  LACTIC ACID, PLASMA  PROTIME-INR  CK TOTAL AND CKMB (NOT AT [facility])  SAMPLE TO BLOOD BANK    EKG None  Radiology DG Pelvis Portable  Result Date: 12/30/2021 CLINICAL DATA:  Trauma, fall EXAM: PORTABLE PELVIS 1-2 VIEWS COMPARISON:  None. FINDINGS: No fracture or dislocation is seen. Degenerative changes are noted in the visualized lower lumbar spine. IMPRESSION: No fracture or dislocation is seen. Electronically Signed   By: Elmer Picker M.D.   On: 12/30/2021 10:06   DG Chest Port 1 View  Result Date: 12/30/2021 CLINICAL DATA:  Trauma, fall EXAM: PORTABLE CHEST 1 VIEW COMPARISON:  08/27/2021 FINDINGS: Transverse diameter of heart is within normal limits. Central pulmonary vessels are slightly less prominent. There are no signs of pulmonary edema or new focal infiltrates. There is no pleural effusion or pneumothorax. IMPRESSION: There are no signs of pulmonary edema or new focal infiltrates. Electronically Signed   By: Elmer Picker M.D.   On: 12/30/2021 10:04    Procedures Procedures     Medications Ordered in ED Medications - No data to display  ED Course/ Medical Decision Making/ A&P                           Medical Decision Making Amount and/or Complexity of Data Reviewed Radiology: ordered.   87 year old female presents with mechanical fall overnight, found this morning.  Unknown downtime.  No report of syncope/loss of consciousness, patient recalls the fall.  Has no acute complaints.  Anticoagulated on Eliquis for history of PE.  CT of the head and neck is unremarkable, c-collar has been cleared.  Small amount of height loss at T1 superior endplates, as well as old L2 compression fracture.  Patient has no acute pain in the thoracic or lumbar spine, doubt that these are acute.  No other signs of other musculoskeletal injury.  Abdomen is soft and benign without any bruising.  Blood work is baseline, mildly elevated CK, patient has been able to orally rehydrate.  No indication for IV fluids.  Vitals are otherwise stable.  Plan for discharge back to facility.  Spouse at bedside updated and agrees.  Patient at this time appears safe and stable for discharge and close outpatient follow up. Discharge plan and strict return to ED precautions discussed, patient verbalizes understanding and agreement.        Final Clinical Impression(s) / ED Diagnoses Final diagnoses:  Trauma    Rx / DC Orders ED Discharge Orders     None         Lorelle Gibbs, DO 12/30/21 1316

## 2021-12-30 NOTE — Progress Notes (Signed)
[facility] responded to level II trauma page in ED for fall on thinners; pt. being attended by medical team when [facility] arrived; at length, [facility] entered room and introduced himself to pt. who shared that she suffered a fall at the facility where she lives in [place]; pt. requested [facility] call husband Pilar Plate to request he come visit.  [facility] called husband and gave general update that pt. is in ED --> provided hospital address; husband will be en route soon and can contact [place] if he has any issues.  No further needs apparent at this time.  Chaplains remain available.  Lindaann Pascal, Chaplain Pager: [phone]

## 2021-12-30 NOTE — Progress Notes (Signed)
Orthopedic Tech Progress Note Patient Details:  [name] 10-03-1935 [id number]  Level 2 trauma   Patient ID: Megan Flores, female   DOB: 10-27-1935, 87 y.o.   MRN: [id number]  Megan Flores 12/30/2021, 12:30 PM

## 2021-12-30 NOTE — ED Notes (Signed)
TO CT with TRN  Rolene Arbour, RN Trauma Response Nurse

## 2021-12-30 NOTE — ED Notes (Signed)
Per EMS pt coming from [place] assisted living. Patient reports fall last night and hit head. Denies any LOC. States she tried to get to restroom and fell. Laid on ground all night and was found on rounds this am. C-collar in place. EMS reports pt also had a fall yesterday but did not get transported to hospital. Takes eliquis.

## 2021-12-30 NOTE — Progress Notes (Incomplete)
Trauma Response Nurse Documentation   Megan Flores is a 87 y.o. female arriving to [facility] ED via EMS  On Eliquis (apixaban) daily. Trauma was activated as a Level 2 by Charge nurse based on the following trauma criteria Elderly patients > 65 with head trauma on anti-coagulation (excluding ASA). Trauma team at the bedside on patient arrival. Patient cleared for CT by Dr. Dina Rich. Patient to CT with team. GCS 15.  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 States fell last night, unable to get up, hit head-- no obvious injury.  A/O x4, cool to touch--  Has tremors --    CT's Completed:   CT Head and CT C-Spine   Interventions:  Basic lab work,  [employer] CT scans  Plan for disposition:  {Trauma [employer]   Consults completed:  {Trauma Consults:26862} at [facility]  Event Summary:  MTP Summary (If applicable):   Bedside handoff with {Trauma handoff:26863::"ED RN"} ***.    Megan Flores  Trauma Response RN  Please call TRN at [phone] for further assistance.

## 2022-09-28 ENCOUNTER — Encounter (HOSPITAL_COMMUNITY): Payer: Self-pay

## 2022-09-28 ENCOUNTER — Emergency Department (HOSPITAL_COMMUNITY)
Admission: EM | Admit: 2022-09-28 | Discharge: 2022-09-29 | Disposition: A | Discharge status: Skilled Nursing Facility | Payer: Medicare Other | Attending: Emergency Medicine | Admitting: Emergency Medicine

## 2022-09-28 ENCOUNTER — Emergency Department (HOSPITAL_COMMUNITY): Payer: Medicare Other

## 2022-09-28 ENCOUNTER — Other Ambulatory Visit: Payer: Self-pay

## 2022-09-28 DIAGNOSIS — M25561 Pain in right knee: Secondary | ICD-10-CM | POA: Insufficient documentation

## 2022-09-28 DIAGNOSIS — W010XXA Fall on same level from slipping, tripping and stumbling without subsequent striking against object, initial encounter: Secondary | ICD-10-CM | POA: Insufficient documentation

## 2022-09-28 DIAGNOSIS — Z7901 Long term (current) use of anticoagulants: Secondary | ICD-10-CM | POA: Insufficient documentation

## 2022-09-28 DIAGNOSIS — Y92129 Unspecified place in nursing home as the place of occurrence of the external cause: Secondary | ICD-10-CM | POA: Insufficient documentation

## 2022-09-28 DIAGNOSIS — M25571 Pain in right ankle and joints of right foot: Secondary | ICD-10-CM | POA: Diagnosis present

## 2022-09-28 DIAGNOSIS — G8911 Acute pain due to trauma: Secondary | ICD-10-CM | POA: Insufficient documentation

## 2022-09-28 DIAGNOSIS — M7989 Other specified soft tissue disorders: Secondary | ICD-10-CM | POA: Diagnosis not present

## 2022-09-28 NOTE — ED Notes (Signed)
Megan Flores pt's daughter wants an update whenever we have one

## 2022-09-28 NOTE — Discharge Instructions (Signed)
You were seen in the emergency department for right leg pain.  You had x-rays of your knee lower leg ankle and foot that did not show any obvious fracture.  We are treating you with an ankle brace for support.  Please use ice and elevation.  Follow-up with your regular doctor.  Return to the emergency department if any worsening or concerning symptoms.

## 2022-09-28 NOTE — ED Triage Notes (Signed)
BIBA from [place] tripped and almost fell and since Rt ankle has been hurting and has some swelling no there complaints.

## 2022-09-28 NOTE — ED Provider Notes (Signed)
[facility] DEPT Provider Note   CSN: [id number] Arrival date & time: 09/28/22  1956     History {Add pertinent medical, surgical, social history, OB history to HPI:1} Chief Complaint  Patient presents with   Ankle Pain    Megan Flores is a 87 y.o. female.  She was sent in from her skilled nursing facility for right leg possibly ankle pain after possibly injuring it yesterday.  Patient actually does not recall any fall.  Patient denies other complaints.  She is on Eliquis for prior PE.  She denies any numbness.  The history is provided by the patient.  Ankle Pain Location:  Ankle, knee and leg Injury: yes   Leg location:  R lower leg Knee location:  R knee Ankle location:  R ankle Pain details:    Quality:  Unable to specify   Severity:  Unable to specify Chronicity:  New Relieved by:  None tried Worsened by:  Bearing weight Ineffective treatments:  None tried Associated symptoms: no fever, no neck pain, no numbness and no swelling        Home Medications Prior to Admission medications   Medication Sig Start Date End Date Taking? Authorizing Provider  apixaban (ELIQUIS) 5 MG TABS tablet Take 2 tablets (10 mg total) by mouth 2 (two) times daily for 6 days, THEN 1 tablet (5 mg total) 2 (two) times daily. Patient taking differently: '5mg'$  twice daily 08/28/21 02/28/22  Jose Persia, MD  Calcium Citrate-Vitamin D (CITRACAL + D PO) Take 1 tablet by mouth 2 (two) times daily.    [provider]  clonazePAM (KLONOPIN) 0.5 MG disintegrating tablet Take 0.5 mg by mouth daily as needed for seizure. Also has Clonazepam 0.'5mg'$  scheduled    [provider]  clonazePAM (KLONOPIN) 0.5 MG tablet Take 0.5 mg by mouth 3 (three) times daily. Also takes Clonazepam 0.'5mg'$  ODT for as needed use    [provider]  divalproex (DEPAKOTE) 250 MG DR tablet Take 250 mg by mouth 3 (three) times daily.    [provider]  donepezil  (ARICEPT) 5 MG tablet Take 5 mg by mouth at bedtime.    [provider]  hydrochlorothiazide (HYDRODIURIL) 25 MG tablet Take 25 mg by mouth daily.    [provider]  losartan (COZAAR) 25 MG tablet Take 25 mg by mouth daily. 12/18/20   [provider]  Melatonin 10 MG TABS Take 10 mg by mouth at bedtime.    [provider]  QUEtiapine (SEROQUEL) 300 MG tablet Take 300 mg by mouth at bedtime.    [provider]  simvastatin (ZOCOR) 20 MG tablet Take 20 mg by mouth at bedtime.    [provider]      Allergies    Aspirin, Levofloxacin, Primidone, Propranolol, Neomycin-bacitracin-polymyxin [bacitracin-neomycin-polymyxin], and Topamax [topiramate]    Review of Systems   Review of Systems  Constitutional:  Negative for fever.  Musculoskeletal:  Negative for neck pain.    Physical Exam Updated Vital Signs BP 132/71 (BP Location: Right Arm)   Pulse 90   Temp 98.3 F (36.8 C) (Oral)   Resp 18   SpO2 95%  Physical Exam Vitals and nursing note reviewed.  Constitutional:      General: She is not in acute distress.    Appearance: Normal appearance. She is well-developed.  HENT:     Head: Normocephalic and atraumatic.  Eyes:     Conjunctiva/sclera: Conjunctivae normal.  Cardiovascular:  Rate and Rhythm: Normal rate and regular rhythm.     Heart sounds: No murmur heard. Pulmonary:     Effort: Pulmonary effort is normal. No respiratory distress.     Breath sounds: Normal breath sounds.  Abdominal:     Palpations: Abdomen is soft.     Tenderness: There is no abdominal tenderness.  Musculoskeletal:        General: Tenderness present. Normal range of motion.     Cervical back: Neck supple.     Comments: Poorly localizing exactly where she hurts but seems to be somewhere between her knee and her ankle.  She has some mild swelling of her ankle no bruising.  Distal neurovascular intact leg is warm and well-perfused.  Calf is supple  without obvious cords.  There is no overlying skin changes.  Skin:    General: Skin is warm and dry.     Capillary Refill: Capillary refill takes less than 2 seconds.  Neurological:     General: No focal deficit present.     Mental Status: She is alert.     Sensory: No sensory deficit.     Motor: No weakness.     ED Results / Procedures / Treatments   Labs (all labs ordered are listed, but only abnormal results are displayed) Labs Reviewed - No data to display  EKG None  Radiology No results found.  Procedures Procedures  {Document cardiac monitor, telemetry assessment procedure when appropriate:1}  Medications Ordered in ED Medications - No data to display  ED Course/ Medical Decision Making/ A&P                           Medical Decision Making Amount and/or Complexity of Data Reviewed Radiology: ordered.   This patient complains of ***; this involves an extensive number of treatment Options and is a complaint that carries with it a high risk of complications and morbidity. The differential includes ***  I ordered, reviewed and interpreted labs, which included *** I ordered medication *** and reviewed PMP when indicated. I ordered imaging studies which included *** and I independently    visualized and interpreted imaging which showed *** Additional history obtained from *** Previous records obtained and reviewed *** I consulted *** and discussed lab and imaging findings and discussed disposition.  Cardiac monitoring reviewed, *** Social determinants considered, *** Critical Interventions: ***  After the interventions stated above, I reevaluated the patient and found *** Admission and further testing considered, ***   {Document critical care time when appropriate:1} {Document review of labs and clinical decision tools ie heart score, Chads2Vasc2 etc:1}  {Document your independent review of radiology images, and any outside records:1} {Document your  discussion with family members, caretakers, and with consultants:1} {Document social determinants of health affecting pt's care:1} {Document your decision making why or why not admission, treatments were needed:1} Final Clinical Impression(s) / ED Diagnoses Final diagnoses:  None    Rx / DC Orders ED Discharge Orders     None

## 2023-04-16 IMAGING — CT CT HEAD W/O CM
4 series · 15 of 47 positions shown, 17 images · non-contrast
Comparison: 08/27/2021

CLINICAL DATA: Trauma



[Series 3: head without · axial · non-contrast · 0.48mm/px · z∈[-74,+51]mm · 7 of 35 slices shown, 9 images]
[im 5/35  brain]
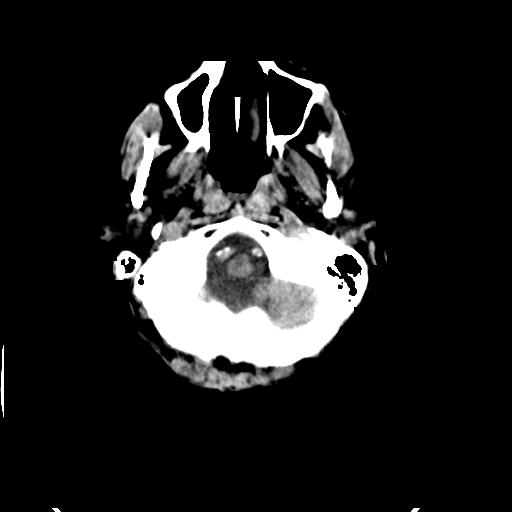
[im 5/35  bone]
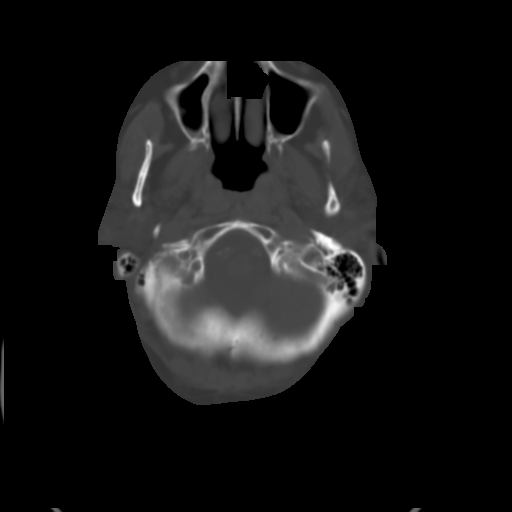
[im 9/35  brain]
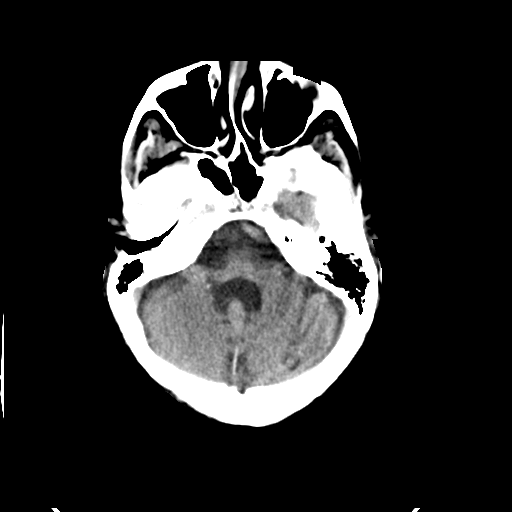
[im 13/35  brain]
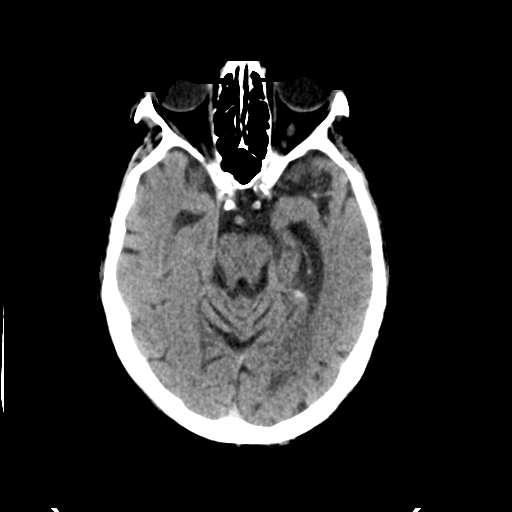
[im 18/35  brain]
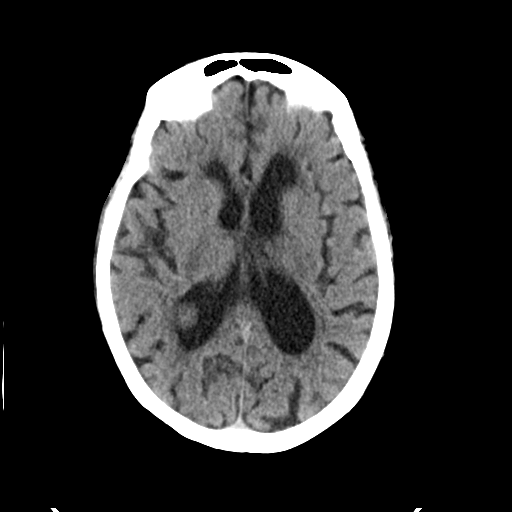
[im 22/35  brain]
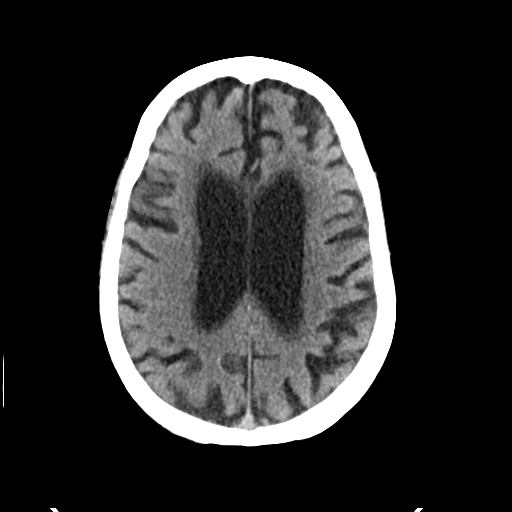
[im 22/35  bone]
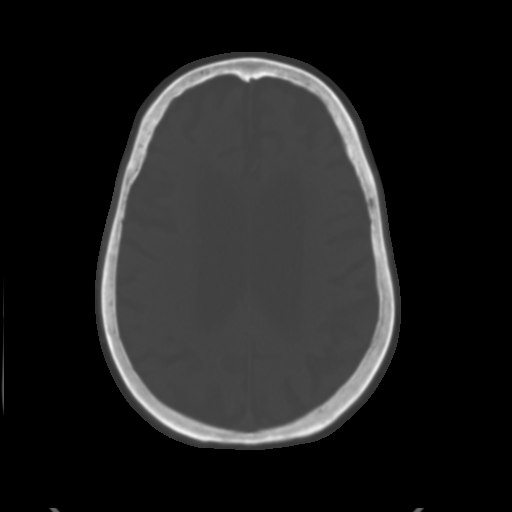
[im 26/35  brain]
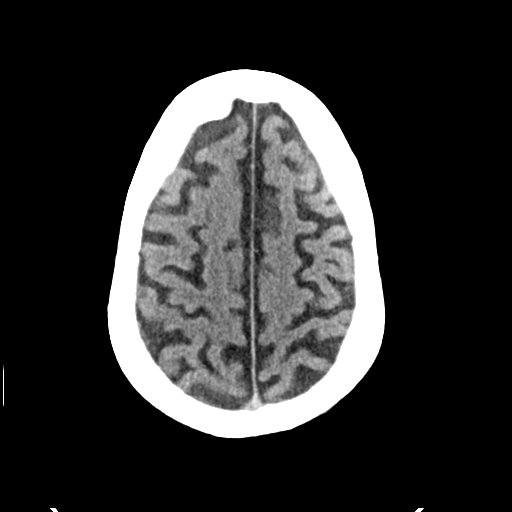
[im 30/35  brain]
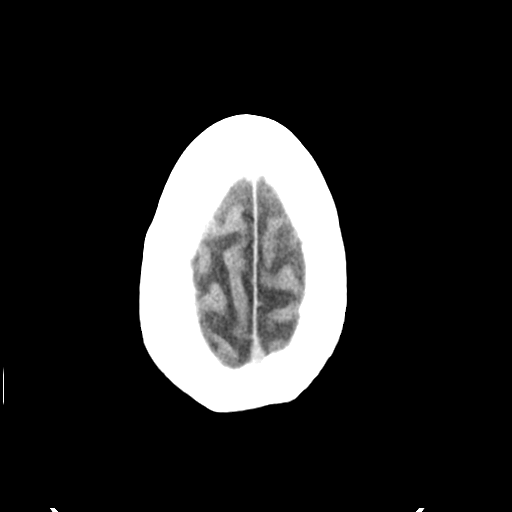

[Series 4: head without sag · sagittal · non-contrast · 0.28mm/px · 3 of 64 slices shown]
[im 22/64  brain]
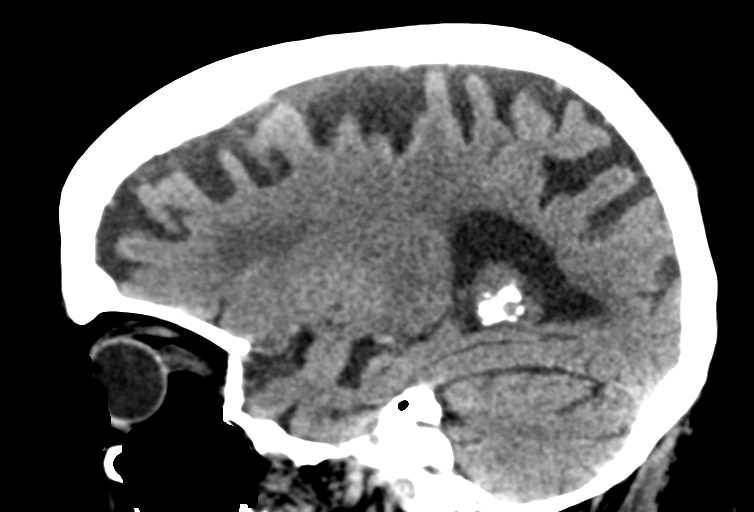
[im 32/64  brain]
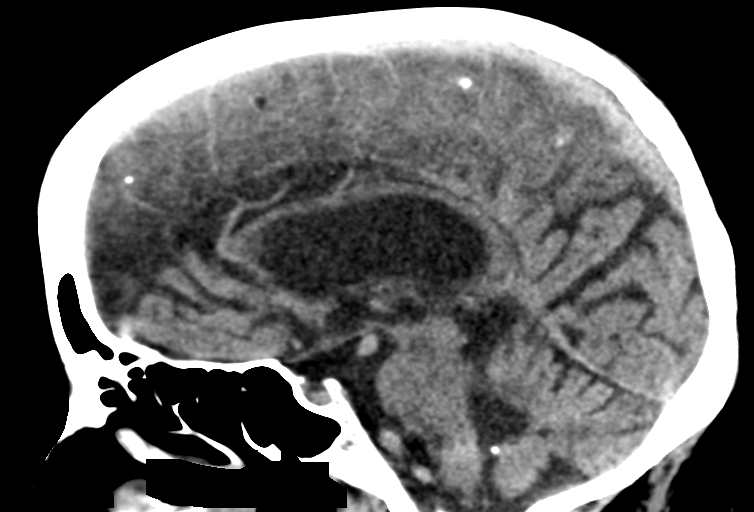
[im 43/64  brain]
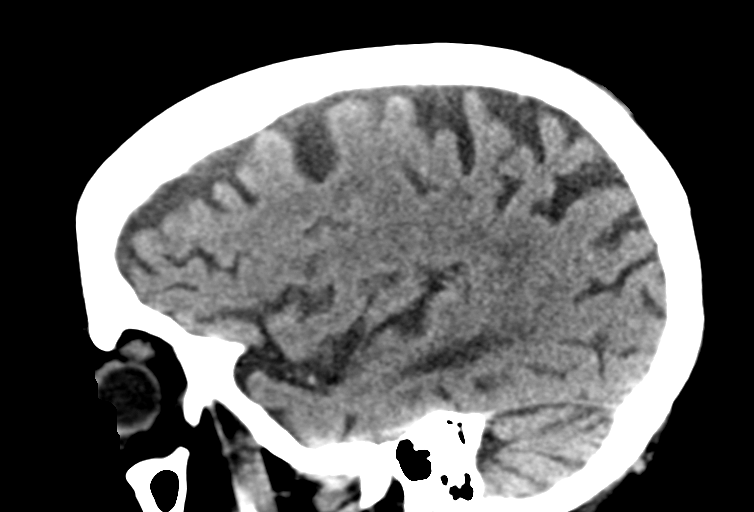

[Series 5: head bone · axial · 0.48mm/px · z∈[-78,-60]mm · 2 of 86 slices shown]
[im 9/86  bone]
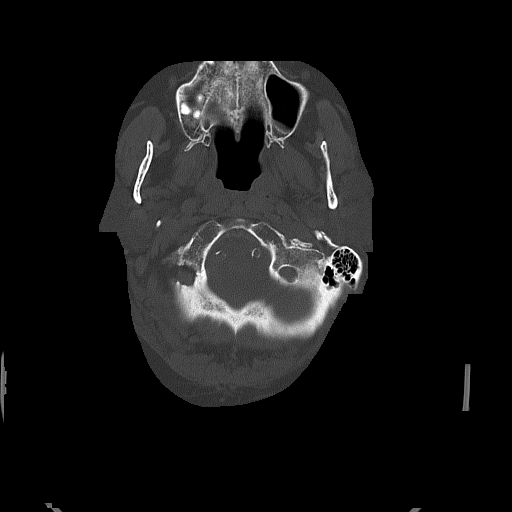
[im 18/86  bone]
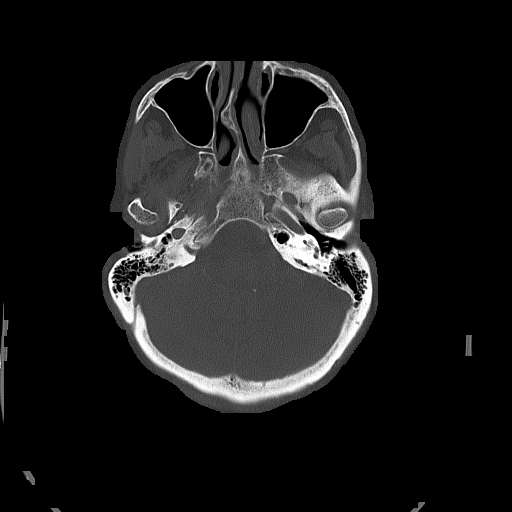

[Series 6: head without cor · coronal · non-contrast · 0.31mm/px · 3 of 71 slices shown]
[im 24/71  brain]
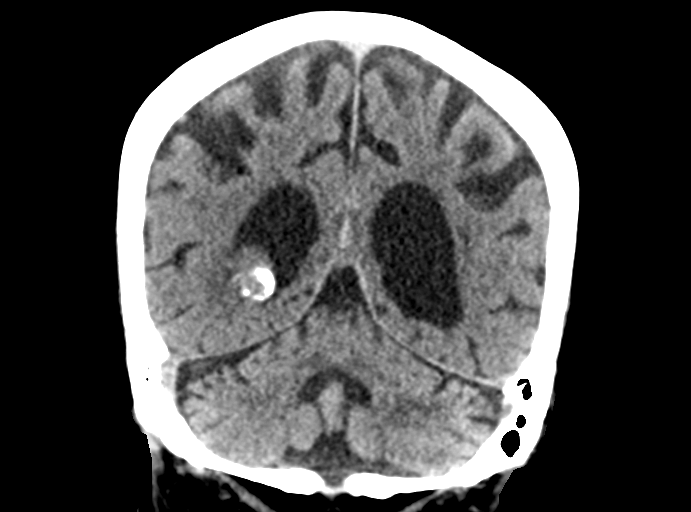
[im 32/71  brain]
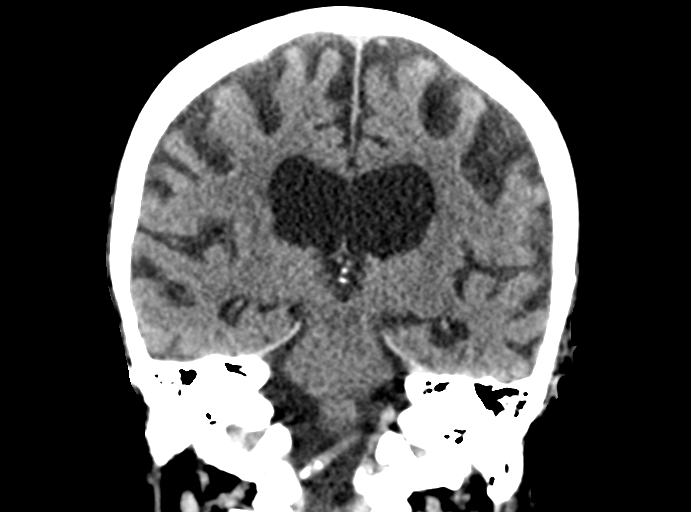
[im 39/71  brain]
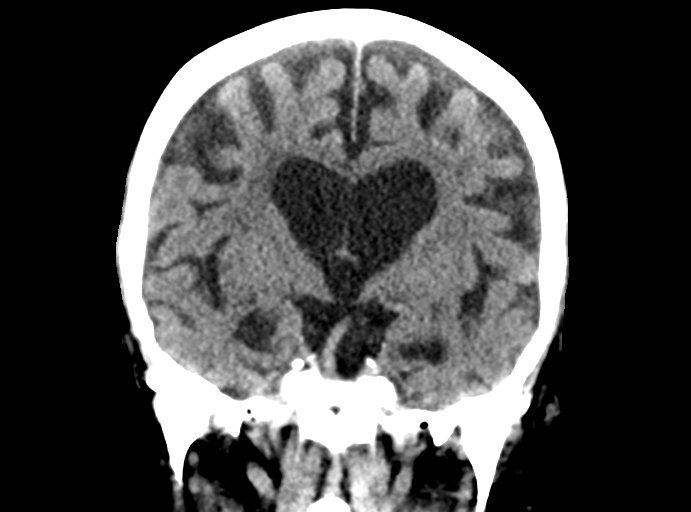

[15 of 47 positions shown; findings below may reference images not displayed]

FINDINGS: Brain: No acute intracranial findings are seen. Cortical sulci are
prominent. There is decreased density in the periventricular white
matter. There is 1.5 cm soft tissue density structure in the choroid
plexus in the occipital horn of right lateral ventricle. There is
1.5 cm nodular density inseparable from the inferior margin of left
tentorium which has not changed in comparison with previous
examinations dating as far back as 06/23/2013.

Vascular: Unremarkable.

Skull: No fracture is seen.

Sinuses/Orbits: Unremarkable.

Other: None
IMPRESSION: No acute intracranial findings are seen. Possible meningioma in the
left tentorium has not changed. There is 1.5 cm mixed attenuation
structure in the right choroid plexus with no significant interval
change. Atrophy. Small-vessel disease.

## 2023-06-24 ENCOUNTER — Emergency Department (HOSPITAL_COMMUNITY): Payer: Medicare Other

## 2023-06-24 ENCOUNTER — Emergency Department (HOSPITAL_COMMUNITY)
Admission: EM | Admit: 2023-06-24 | Discharge: 2023-06-24 | Disposition: A | Discharge status: Home / Self Care | Payer: Medicare Other | Attending: Emergency Medicine | Admitting: Emergency Medicine

## 2023-06-24 DIAGNOSIS — Z7901 Long term (current) use of anticoagulants: Secondary | ICD-10-CM | POA: Insufficient documentation

## 2023-06-24 DIAGNOSIS — Z7982 Long term (current) use of aspirin: Secondary | ICD-10-CM | POA: Diagnosis not present

## 2023-06-24 DIAGNOSIS — F0390 Unspecified dementia without behavioral disturbance: Secondary | ICD-10-CM | POA: Diagnosis not present

## 2023-06-24 DIAGNOSIS — S0083XA Contusion of other part of head, initial encounter: Secondary | ICD-10-CM | POA: Diagnosis not present

## 2023-06-24 DIAGNOSIS — S0990XA Unspecified injury of head, initial encounter: Secondary | ICD-10-CM | POA: Diagnosis present

## 2023-06-24 DIAGNOSIS — Y92129 Unspecified place in nursing home as the place of occurrence of the external cause: Secondary | ICD-10-CM | POA: Insufficient documentation

## 2023-06-24 DIAGNOSIS — W1812XA Fall from or off toilet with subsequent striking against object, initial encounter: Secondary | ICD-10-CM | POA: Diagnosis not present

## 2023-06-24 DIAGNOSIS — W19XXXA Unspecified fall, initial encounter: Secondary | ICD-10-CM

## 2023-06-24 LAB — BASIC METABOLIC PANEL
Anion gap: 14 (ref 5–15)
BUN: 16 mg/dL (ref 8–23)
CO2: 24 mmol/L (ref 22–32)
Calcium: 9.4 mg/dL (ref 8.9–10.3)
Chloride: 97 mmol/L — ABNORMAL LOW (ref 98–111)
Creatinine, Ser: 0.79 mg/dL (ref 0.44–1.00)
GFR, Estimated: >60 mL/min (ref >60)
Glucose, Bld: 131 mg/dL — ABNORMAL HIGH (ref 70–99)
Potassium: 3.5 mmol/L (ref 3.5–5.1)
Sodium: 135 mmol/L (ref 135–145)

## 2023-06-24 LAB — CBC WITH DIFFERENTIAL/PLATELET
Abs Immature Granulocytes: 0.02 10*3/uL (ref 0.00–0.07)
Basophils Absolute: 0 10*3/uL (ref 0.0–0.1)
Basophils Relative: 1 %
Eosinophils Absolute: 0.2 10*3/uL (ref 0.0–0.5)
Eosinophils Relative: 3 %
HCT: 40 % (ref 36.0–46.0)
Hemoglobin: 12.7 g/dL (ref 12.0–15.0)
Immature Granulocytes: 0 %
Lymphocytes Relative: 36 %
Lymphs Abs: 2.1 10*3/uL (ref 0.7–4.0)
MCH: 29.4 pg (ref 26.0–34.0)
MCHC: 31.8 g/dL (ref 30.0–36.0)
MCV: 92.6 fL (ref 80.0–100.0)
Monocytes Absolute: 0.8 10*3/uL (ref 0.1–1.0)
Monocytes Relative: 14 %
Neutro Abs: 2.8 10*3/uL (ref 1.7–7.7)
Neutrophils Relative %: 46 %
Platelets: 154 10*3/uL (ref 150–400)
RBC: 4.32 MIL/uL (ref 3.87–5.11)
RDW: 13.1 % (ref 11.5–15.5)
WBC: 5.9 10*3/uL (ref 4.0–10.5)
nRBC: 0 % (ref 0.0–0.2)

## 2023-06-24 LAB — PROTIME-INR
INR: 1 (ref 0.8–1.2)
Prothrombin Time: 12.8 seconds (ref 11.4–15.2)

## 2023-06-24 NOTE — ED Triage Notes (Signed)
Pt arrives To ED c/o mechanical from standing at [facility] facility. Pt hit back of head on toilet seat, no LOC or thinners reported. Pt normally ambulatory with walker. Bump noted to base of head

## 2023-06-24 NOTE — ED Notes (Signed)
Trauma Response Nurse Documentation   NDIDI NESBY is a 88 y.o. female arriving to [facility] ED via EMS  On Eliquis (apixaban) daily. Trauma was activated as a Level 2 by Dr. Manus Gunning based on the following trauma criteria Elderly patients > 65 with head trauma on anti-coagulation (excluding ASA).  Patient cleared for CT by Dr. Manus Gunning. Pt transported to CT with trauma response nurse present to monitor. RN remained with the patient throughout their absence from the department for clinical observation.   GCS 15.  History   Past Medical History:  Diagnosis Date   Asthma    Bipolar 1 disorder ([facility])    Breast cancer ([facility]) 1998   left breast cancer/radiation   Cancer of breast ([facility])    Cancer of face ([facility])    Essential tremor 06/14/2013   HTN (hypertension)    Hypercholesteremia    Osteoarthritis    Peripheral neuropathy    Tremor      Past Surgical History:  Procedure Laterality Date   BREAST LUMPECTOMY Left 1998   pos   ORIF TIBIAL SHAFT FRACTURE W/ PLATES AND SCREWS Left    VAGINAL HYSTERECTOMY         Initial Focused Assessment (If applicable, or please see trauma documentation): Airway-- intact, no visible obstruction Breathing-- spontaneous, unlabored Circulation-- no obvious bleeding noted  CT's Completed:   CT Head and CT C-Spine   Interventions:  See event summary  Plan for disposition:  Unknown at this time  Consults completed:  None at time of this note  Event Summary: Patient brought in by [facility] from SNF. Patient with mechanical fall trying to go to the restroom. Patient unsure of loss of consciousness. Lab work obtained. Patient transported to CT. CT head and c-spine completed.  MTP Summary (If applicable):  N/A  Bedside handoff with ED RN [facility].    Leota Sauers  Trauma Response RN  Please call TRN at [phone] for further assistance.

## 2023-06-24 NOTE — ED Provider Notes (Signed)
[facility] AT [facility] Provider Note   CSN: [id number] Arrival date & time: 06/24/23  0055     History  Chief Complaint  Patient presents with   Megan Flores is a 88 y.o. female who presents via EMS from [facility] nursing facility after mechanical fall.  Was using the restroom, stood up from the toilet and lost her balance fell back and hit her head on the toilet seat.  No LOC.  Initially there was report of no anticoagulation, however patient does have Eliquis listed as an active medication in her medical record.  Medication list sent from facility with the names of the drugs cut off on the sheet, not visible.  Patient with history of dementia, noncontributory to history.  Level 5 caveat secondary to dementia.  HPI     Home Medications Prior to Admission medications   Medication Sig Start Date End Date Taking? Authorizing Provider  apixaban (ELIQUIS) 5 MG TABS tablet Take 2 tablets (10 mg total) by mouth 2 (two) times daily for 6 days, THEN 1 tablet (5 mg total) 2 (two) times daily. Patient taking differently: 5mg  twice daily 08/28/21 02/28/22  Verdene Lennert, MD  aspirin EC 81 MG tablet Take 81 mg by mouth daily. Swallow whole.    [provider]  Calcium Citrate-Vitamin D (CITRACAL + D PO) Take 1 tablet by mouth 2 (two) times daily.    [provider]  clonazePAM (KLONOPIN) 0.5 MG disintegrating tablet Take 0.5 mg by mouth daily as needed for seizure. Also has Clonazepam 0.5mg  scheduled    [provider]  divalproex (DEPAKOTE) 250 MG DR tablet Take 250 mg by mouth 3 (three) times daily.    [provider]  donepezil (ARICEPT) 5 MG tablet Take 5 mg by mouth at bedtime.    [provider]  hydrochlorothiazide (HYDRODIURIL) 25 MG tablet Take 25 mg by mouth daily.    [provider]  losartan (COZAAR) 25 MG tablet Take 25 mg by mouth daily. 12/18/20   [provider]   Melatonin 10 MG TABS Take 10 mg by mouth at bedtime.    [provider]  QUEtiapine (SEROQUEL) 300 MG tablet Take 300 mg by mouth at bedtime.    [provider]  simvastatin (ZOCOR) 20 MG tablet Take 20 mg by mouth at bedtime.    [provider]      Allergies    Aspirin, Levofloxacin, Primidone, Propranolol, Neomycin-bacitracin-polymyxin [bacitracin-neomycin-polymyxin], and Topamax [topiramate]    Review of Systems   Review of Systems  Unable to perform ROS: Dementia    Physical Exam Updated Vital Signs BP 128/69 (BP Location: Right Arm)   Pulse 93   Temp 98.2 F (36.8 C) (Oral)   Resp 16   Ht 5\' 5"  (1.651 m)   Wt 74.8 kg   SpO2 98%   BMI 27.46 kg/m  Physical Exam Vitals and nursing note reviewed.  Constitutional:      Appearance: She is not ill-appearing or toxic-appearing.  HENT:     Head: Normocephalic and atraumatic. No raccoon eyes or Battle's sign.      Right Ear: No hemotympanum.     Left Ear: No hemotympanum.     Nose: Nose normal.     Mouth/Throat:     Mouth: Mucous membranes are moist.     Pharynx: Oropharynx is clear. No oropharyngeal exudate or posterior oropharyngeal erythema.  Eyes:     General: Lids are  normal. Vision grossly intact.        Right eye: No discharge.        Left eye: No discharge.     Extraocular Movements: Extraocular movements intact.     Conjunctiva/sclera: Conjunctivae normal.     Pupils: Pupils are equal, round, and reactive to light.  Neck:     Trachea: Trachea and phonation normal.     Comments: Full active range of motion of the C-spine without pain. Cardiovascular:     Rate and Rhythm: Normal rate and regular rhythm.     Pulses: Normal pulses.     Heart sounds: Normal heart sounds.  Pulmonary:     Effort: Pulmonary effort is normal. No respiratory distress.     Breath sounds: Normal breath sounds. No wheezing or rales.  Chest:     Chest wall: No mass, lacerations, deformity, swelling,  tenderness, crepitus or edema.  Abdominal:     General: Bowel sounds are normal. There is no distension.     Palpations: Abdomen is soft.     Tenderness: There is no abdominal tenderness. There is no guarding or rebound.  Musculoskeletal:        General: No deformity.     Cervical back: Normal range of motion and neck supple. No spinous process tenderness or muscular tenderness.     Right lower leg: No edema.     Left lower leg: No edema.     Comments: Moving all limbs spontaneously without difficulty, no deformities to the extremities bilaterally..  Lymphadenopathy:     Cervical: No cervical adenopathy.  Skin:    General: Skin is warm and dry.     Capillary Refill: Capillary refill takes less than 2 seconds.  Neurological:     General: No focal deficit present.     Mental Status: She is alert. Mental status is at baseline.     Comments: A&O x 2  Psychiatric:        Mood and Affect: Mood normal.     ED Results / Procedures / Treatments   Labs (all labs ordered are listed, but only abnormal results are displayed) Labs Reviewed  BASIC METABOLIC PANEL - Abnormal; Notable for the following components:      Result Value   Chloride 97 (*)    Glucose, Bld 131 (*)    All other components within normal limits  CBC WITH DIFFERENTIAL/PLATELET  PROTIME-INR    EKG None  Radiology DG HIP UNILAT WITH PELVIS 2-3 VIEWS RIGHT  Result Date: 06/24/2023 CLINICAL DATA:  Right hip pain. EXAM: DG HIP (WITH OR WITHOUT PELVIS) 2-3V RIGHT COMPARISON:  None Available. FINDINGS: There is no evidence of hip fracture or dislocation. Moderate severity degenerative changes are seen form of joint space narrowing and acetabular sclerosis. IMPRESSION: Moderate severity degenerative changes. Electronically Signed   By: Aram Candela M.D.   On: 06/24/2023 03:39   DG Chest Portable 1 View  Result Date: 06/24/2023 CLINICAL DATA:  Fall. EXAM: PORTABLE CHEST 1 VIEW COMPARISON:  12/30/2021. FINDINGS: The  heart size and mediastinal contours are within normal limits. There is atherosclerotic calcification of the aorta. No consolidation, effusion, or pneumothorax. No acute osseous abnormality. IMPRESSION: No active disease. Electronically Signed   By: Thornell Sartorius M.D.   On: 06/24/2023 02:57   DG Pelvis Portable  Result Date: 06/24/2023 CLINICAL DATA:  Fall. EXAM: PORTABLE PELVIS 1-2 VIEWS COMPARISON:  12/30/2021. FINDINGS: There is a bony density inferior to the femoral head on the right. No dislocation  is seen. Mild degenerative changes are present at the hips bilaterally. Degenerative changes are present in the lower lumbar spine. Vascular calcifications are noted in the lower extremities bilaterally. IMPRESSION: Calcification along the inferior aspect of the femoral head on the right, possible fracture fragment versus vascular calcification. Dedicated images of the right hip are recommended. Electronically Signed   By: Thornell Sartorius M.D.   On: 06/24/2023 02:53   CT Head Wo Contrast  Result Date: 06/24/2023 CLINICAL DATA:  Head and neck trauma, fall. EXAM: CT HEAD WITHOUT CONTRAST CT CERVICAL SPINE WITHOUT CONTRAST TECHNIQUE: Multidetector CT imaging of the head and cervical spine was performed following the standard protocol without intravenous contrast. Multiplanar CT image reconstructions of the cervical spine were also generated. RADIATION DOSE REDUCTION: This exam was performed according to the departmental dose-optimization program which includes automated exposure control, adjustment of the mA and/or kV according to patient size and/or use of iterative reconstruction technique. COMPARISON:  12/30/2021. FINDINGS: CT HEAD FINDINGS Brain: No acute intracranial hemorrhage, midline shift or mass effect. No extra-axial fluid collection is seen. Diffuse atrophy is noted. Periventricular white matter hypodensities are present bilaterally. There is ex vacuo dilatation of the ventricles, similar in appearance to  the prior exam. A stable soft tissue mass is noted in the posterior horn of the right lateral ventricle measuring 1.4 cm. There is a stable hyperdense extra-axial infratentorial mass on the left measuring 1.4 cm, likely meningioma. Vascular: No hyperdense vessel or unexpected calcification. Skull: Normal. Negative for fracture or focal lesion. Sinuses/Orbits: No acute finding. Other: None. CT CERVICAL SPINE FINDINGS Alignment: Normal. Skull base and vertebrae: No acute fracture. No primary bone lesion or focal pathologic process. Soft tissues and spinal canal: No prevertebral fluid or swelling. No visible canal hematoma. Disc levels: Multilevel intervertebral disc space narrowing, uncovertebral osteophyte formation, and facet arthropathy. Upper chest: No acute abnormality. Other: Carotid artery calcifications. IMPRESSION: 1. No acute intracranial process. 2. Atrophy with chronic microvascular ischemic changes. 3. Stable soft tissue masses in the posterior horn of the right lateral ventricle and infratentorial region on the left. 4. Multilevel degenerative changes in the cervical spine without evidence of acute fracture. Electronically Signed   By: Thornell Sartorius M.D.   On: 06/24/2023 01:46   CT Cervical Spine Wo Contrast  Result Date: 06/24/2023 CLINICAL DATA:  Head and neck trauma, fall. EXAM: CT HEAD WITHOUT CONTRAST CT CERVICAL SPINE WITHOUT CONTRAST TECHNIQUE: Multidetector CT imaging of the head and cervical spine was performed following the standard protocol without intravenous contrast. Multiplanar CT image reconstructions of the cervical spine were also generated. RADIATION DOSE REDUCTION: This exam was performed according to the departmental dose-optimization program which includes automated exposure control, adjustment of the mA and/or kV according to patient size and/or use of iterative reconstruction technique. COMPARISON:  12/30/2021. FINDINGS: CT HEAD FINDINGS Brain: No acute intracranial hemorrhage,  midline shift or mass effect. No extra-axial fluid collection is seen. Diffuse atrophy is noted. Periventricular white matter hypodensities are present bilaterally. There is ex vacuo dilatation of the ventricles, similar in appearance to the prior exam. A stable soft tissue mass is noted in the posterior horn of the right lateral ventricle measuring 1.4 cm. There is a stable hyperdense extra-axial infratentorial mass on the left measuring 1.4 cm, likely meningioma. Vascular: No hyperdense vessel or unexpected calcification. Skull: Normal. Negative for fracture or focal lesion. Sinuses/Orbits: No acute finding. Other: None. CT CERVICAL SPINE FINDINGS Alignment: Normal. Skull base and vertebrae: No acute fracture. No primary bone  lesion or focal pathologic process. Soft tissues and spinal canal: No prevertebral fluid or swelling. No visible canal hematoma. Disc levels: Multilevel intervertebral disc space narrowing, uncovertebral osteophyte formation, and facet arthropathy. Upper chest: No acute abnormality. Other: Carotid artery calcifications. IMPRESSION: 1. No acute intracranial process. 2. Atrophy with chronic microvascular ischemic changes. 3. Stable soft tissue masses in the posterior horn of the right lateral ventricle and infratentorial region on the left. 4. Multilevel degenerative changes in the cervical spine without evidence of acute fracture. Electronically Signed   By: Thornell Sartorius M.D.   On: 06/24/2023 01:46    Procedures .Critical Care  Performed by: Paris Lore, PA-C Authorized by: Paris Lore, PA-C   Critical care provider statement:    Critical care time (minutes):  45   Critical care was time spent personally by me on the following activities:  Development of treatment plan with patient or surrogate, discussions with consultants, evaluation of patient's response to treatment, examination of patient, obtaining history from patient or surrogate, ordering and performing  treatments and interventions, ordering and review of laboratory studies, ordering and review of radiographic studies, pulse oximetry and re-evaluation of patient's condition     Medications Ordered in ED Medications - No data to display  ED Course/ Medical Decision Making/ A&P                                 Medical Decision Making 88 year old female with fall with head trauma.  No LOC.  Unclear anticoagulation with Eliquis.  Normal vital signs in the emergency department, cardiopulmonary and abdominal exams are unremarkable, no evidence of trauma to the chest abdomen or pelvis.  No deformities of the extremities.  Patient at neurologic baseline with dementia.  Hematoma to the posterior parietal scalp without laceration.  Level of 2 trauma activated by this provider due to question of anticoagulation.  Amount and/or Complexity of Data Reviewed Labs: ordered.    Details: CBC unremarkable. BMP unremarkable, INR normal.  Radiology: ordered.    Details: Chest x-ray unremarkable, question of possible abnormality of the right hip pelvic x-ray, plain film of the right hip with degenerative changes without acute abnormality.  CT of the head and C-spine without acute finding as well.   88 year old female with mechanical fall, ambulatory with walker.  Physical exam and workup reassuring in the ED.  Vital signs are normal throughout her stay in the ED.  Patient well-appearing, hemodynamically stable and safe for discharge at this time.  [place] and her husband  voiced understanding of her medical evaluation and treatment plan. Each of their questions answered to their expressed satisfaction.  Return precautions were given.  Patient is well-appearing, stable, and was discharged in good condition.  Patient to go POV to her facility, husband to transport.  This chart was dictated using voice recognition software, Dragon. Despite the best efforts of this provider to proofread and correct errors, errors  may still occur which can change documentation meaning.   Final Clinical Impression(s) / ED Diagnoses Final diagnoses:  Fall, initial encounter    Rx / DC Orders ED Discharge Orders     None         Sherrilee Gilles 06/24/23 [id number]    Glynn Octave, MD 06/24/23 0630

## 2023-06-24 NOTE — Discharge Instructions (Signed)
Megan Flores was seen in the ER today after a fall.  Fortunately there is no bleeding in her brain and there are no broken bones on her x-rays.  May follow-up with her primary care doctor.  Return to the ER with any severe symptoms.

## 2023-06-24 NOTE — ED Notes (Signed)
Patient transported to CT 

## 2023-11-18 ENCOUNTER — Emergency Department (HOSPITAL_COMMUNITY): Payer: Medicare Other

## 2023-11-18 ENCOUNTER — Emergency Department (HOSPITAL_COMMUNITY)
Admission: EM | Admit: 2023-11-18 | Discharge: 2023-11-19 | Disposition: A | Discharge status: Home / Self Care | Payer: Medicare Other | Attending: Emergency Medicine | Admitting: Emergency Medicine

## 2023-11-18 ENCOUNTER — Encounter (HOSPITAL_COMMUNITY): Payer: Self-pay

## 2023-11-18 DIAGNOSIS — W1830XA Fall on same level, unspecified, initial encounter: Secondary | ICD-10-CM | POA: Diagnosis not present

## 2023-11-18 DIAGNOSIS — R519 Headache, unspecified: Secondary | ICD-10-CM | POA: Insufficient documentation

## 2023-11-18 DIAGNOSIS — J45909 Unspecified asthma, uncomplicated: Secondary | ICD-10-CM | POA: Diagnosis not present

## 2023-11-18 DIAGNOSIS — I10 Essential (primary) hypertension: Secondary | ICD-10-CM | POA: Insufficient documentation

## 2023-11-18 DIAGNOSIS — W19XXXA Unspecified fall, initial encounter: Secondary | ICD-10-CM

## 2023-11-18 DIAGNOSIS — Z853 Personal history of malignant neoplasm of breast: Secondary | ICD-10-CM | POA: Insufficient documentation

## 2023-11-18 DIAGNOSIS — M545 Low back pain, unspecified: Secondary | ICD-10-CM | POA: Diagnosis not present

## 2023-11-18 DIAGNOSIS — K8689 Other specified diseases of pancreas: Secondary | ICD-10-CM

## 2023-11-18 LAB — CBC WITH DIFFERENTIAL/PLATELET
Abs Immature Granulocytes: 0.04 10*3/uL (ref 0.00–0.07)
Basophils Absolute: 0 10*3/uL (ref 0.0–0.1)
Basophils Relative: 0 %
Eosinophils Absolute: 0.1 10*3/uL (ref 0.0–0.5)
Eosinophils Relative: 1 %
HCT: 36.4 % (ref 36.0–46.0)
Hemoglobin: 11.7 g/dL — ABNORMAL LOW (ref 12.0–15.0)
Immature Granulocytes: 1 %
Lymphocytes Relative: 15 %
Lymphs Abs: 1.2 10*3/uL (ref 0.7–4.0)
MCH: 28.6 pg (ref 26.0–34.0)
MCHC: 32.1 g/dL (ref 30.0–36.0)
MCV: 89 fL (ref 80.0–100.0)
Monocytes Absolute: 1.1 10*3/uL — ABNORMAL HIGH (ref 0.1–1.0)
Monocytes Relative: 14 %
Neutro Abs: 5.4 10*3/uL (ref 1.7–7.7)
Neutrophils Relative %: 69 %
Platelets: 158 10*3/uL (ref 150–400)
RBC: 4.09 MIL/uL (ref 3.87–5.11)
RDW: 12.8 % (ref 11.5–15.5)
WBC: 7.8 10*3/uL (ref 4.0–10.5)
nRBC: 0 % (ref 0.0–0.2)

## 2023-11-18 LAB — BASIC METABOLIC PANEL
Anion gap: 15 (ref 5–15)
BUN: 17 mg/dL (ref 8–23)
CO2: 25 mmol/L (ref 22–32)
Calcium: 9.8 mg/dL (ref 8.9–10.3)
Chloride: 92 mmol/L — ABNORMAL LOW (ref 98–111)
Creatinine, Ser: 0.94 mg/dL (ref 0.44–1.00)
GFR, Estimated: 59 mL/min — ABNORMAL LOW (ref >60)
Glucose, Bld: 158 mg/dL — ABNORMAL HIGH (ref 70–99)
Potassium: 3.1 mmol/L — ABNORMAL LOW (ref 3.5–5.1)
Sodium: 132 mmol/L — ABNORMAL LOW (ref 135–145)

## 2023-11-18 MED ORDER — POTASSIUM CHLORIDE CRYS ER 20 MEQ PO TBCR
40.0000 meq | EXTENDED_RELEASE_TABLET | Freq: Once | ORAL | Status: AC
  Administered 2023-11-19: 40 meq via ORAL
  Filled 2023-11-18: qty 2

## 2023-11-18 MED ORDER — ACETAMINOPHEN 500 MG PO TABS
1000.0000 mg | ORAL_TABLET | Freq: Once | ORAL | Status: AC
  Administered 2023-11-19: 1000 mg via ORAL
  Filled 2023-11-18: qty 2

## 2023-11-18 NOTE — ED Notes (Signed)
When you get a chance call lisa the daug for an update  [phone]

## 2023-11-18 NOTE — ED Provider Triage Note (Addendum)
Emergency Medicine Provider Triage Evaluation Note  Megan Flores , a 88 y.o. female  was evaluated in triage.  Pt complains of fall.  Review of Systems  Positive:  Negative:   Physical Exam  BP 124/62 (BP Location: Right Arm)   Pulse (!) 101   Temp 97.7 F (36.5 C) (Oral)   Resp 16   SpO2 97%  Gen:   Awake, no distress   Resp:  Normal effort  MSK:   Moves extremities without difficulty  Other:    Medical Decision Making  Medically screening exam initiated at 9:57 PM.  Appropriate orders placed.  Megan Flores was informed that the remainder of the evaluation will be completed by another provider, this initial triage assessment does not replace that evaluation, and the importance of remaining in the ED until their evaluation is complete.  Unwitnessed fall at nursing home. Patient stating that she tripped and fell. States that she hit her head. Denies LOC, seizure, blood thinners. Stating that her whole body hurts. Not localizing pain to any specific location. States that it "bothered" her when I palpated LE, but then stated that it didn't hurt.     Dorthy Cooler, [place] 11/18/23 2201

## 2023-11-18 NOTE — ED Triage Notes (Signed)
Pt is coming [facility] for an unwitnessed fall for an unknown downtime but patient states it was not for long. She did hit her head but is not on blood thinners. She is complaining of pain in the back of her head. She did no have any LOC. No skin abrasions. She states she was just walking with her walker going to her bedroom and lost her balance, she uses a walker regularly   Medic vitals   122/68 106hr [id number] [id number]

## 2023-11-18 NOTE — ED Provider Notes (Signed)
[place] EMERGENCY DEPARTMENT AT [facility] Provider Note  CSN: [id number] Arrival date & time: 11/18/23 2141  Chief Complaint(s) Fall  HPI Megan Flores is a 88 y.o. female with past medical history as below, significant for bipolar 1 disorder, essential tremor, hypertension, HLD who presents to the ED with complaint of fall  Patient arrives from SNF secondary to fall.  Patient reports that she was using her walker and lost her balance and fell to the ground.  Reports that her entire body is hurting.  She denies LOC or headache.  No chest pain or palpitations, no nausea or vomiting.  Unable to get up from the floor secondary to pain from the fall.  Upon my assessment she is eager for discharge  Past Medical History Past Medical History:  Diagnosis Date   Asthma    Bipolar 1 disorder (HCC)    Breast cancer (HCC) 1998   left breast cancer/radiation   Cancer of breast (HCC)    Cancer of face (HCC)    Essential tremor 06/14/2013   HTN (hypertension)    Hypercholesteremia    Osteoarthritis    Peripheral neuropathy    Tremor    Patient Active Problem List   Diagnosis Date Noted   Syncope and collapse 08/27/2021   Acute pulmonary embolism (HCC) 08/27/2021   Altered mental status 08/09/2021   Personal history of other malignant neoplasm of skin 10/23/2020   Fasting hyperglycemia 12/02/2016   Frequent falls 07/20/2016   Advanced directives, counseling/discussion 11/06/2015   Chronic hyponatremia 09/13/2014   Chronic insomnia 09/13/2014   Catatonia 03/03/2014   Delirium due to multiple etiologies, acute, hyperactive 02/26/2014   Essential tremor 06/14/2013   Reactive airway disease 05/25/2013   Bipolar 1 disorder (HCC) 05/25/2013   Meningioma (HCC) 05/25/2013   Osteoarthritis 05/25/2013   Peripheral neuropathy 05/25/2013   Pure hypercholesterolemia 05/25/2013   Home Medication(s) Prior to Admission medications   Medication Sig Start Date End Date Taking?  Authorizing Provider  apixaban (ELIQUIS) 5 MG TABS tablet Take 2 tablets (10 mg total) by mouth 2 (two) times daily for 6 days, THEN 1 tablet (5 mg total) 2 (two) times daily. Patient taking differently: 5mg  twice daily 08/28/21 02/28/22  Verdene Lennert, MD  aspirin EC 81 MG tablet Take 81 mg by mouth daily. Swallow whole.    [provider]  Calcium Citrate-Vitamin D (CITRACAL + D PO) Take 1 tablet by mouth 2 (two) times daily.    [provider]  clonazePAM (KLONOPIN) 0.5 MG disintegrating tablet Take 0.5 mg by mouth daily as needed for seizure. Also has Clonazepam 0.5mg  scheduled    [provider]  divalproex (DEPAKOTE) 250 MG DR tablet Take 250 mg by mouth 3 (three) times daily.    [provider]  donepezil (ARICEPT) 5 MG tablet Take 5 mg by mouth at bedtime.    [provider]  hydrochlorothiazide (HYDRODIURIL) 25 MG tablet Take 25 mg by mouth daily.    [provider]  losartan (COZAAR) 25 MG tablet Take 25 mg by mouth daily. 12/18/20   [provider]  Melatonin 10 MG TABS Take 10 mg by mouth at bedtime.    [provider]  QUEtiapine (SEROQUEL) 300 MG tablet Take 300 mg by mouth at bedtime.    [provider]  simvastatin (ZOCOR) 20 MG tablet Take 20 mg by mouth at bedtime.    [provider]  Past Surgical History Past Surgical History:  Procedure Laterality Date   BREAST LUMPECTOMY Left 1998   pos   ORIF TIBIAL SHAFT FRACTURE W/ PLATES AND SCREWS Left    VAGINAL HYSTERECTOMY     Family History Family History  Problem Relation Age of Onset   Cancer Mother    Breast cancer Mother 15   Heart failure Father    Ovarian cancer Sister 83   Alzheimer's disease Brother     Social History Social History   Tobacco Use   Smoking status: Never   Smokeless tobacco:  Never  Vaping Use   Vaping status: Never Used  Substance Use Topics   Alcohol use: No   Drug use: No   Allergies Aspirin, Levofloxacin, Primidone, Propranolol, Neomycin-bacitracin-polymyxin [bacitracin-neomycin-polymyxin], and Topamax [topiramate]  Review of Systems Review of Systems  Constitutional:  Negative for fever.  Respiratory:  Negative for chest tightness and shortness of breath.   Cardiovascular:  Negative for chest pain and palpitations.  Gastrointestinal:  Negative for abdominal pain, nausea and vomiting.  Musculoskeletal:  Positive for arthralgias.  Skin:  Negative for wound.  Neurological:  Positive for headaches. Negative for weakness.  All other systems reviewed and are negative.   Physical Exam Vital Signs  I have reviewed the triage vital signs BP 124/62 (BP Location: Right Arm)   Pulse (!) 101   Temp 97.7 F (36.5 C) (Oral)   Resp 16   SpO2 97%  Physical Exam Vitals and nursing note reviewed.  Constitutional:      General: She is not in acute distress.    Appearance: Normal appearance. She is not ill-appearing.  HENT:     Head: Normocephalic and atraumatic.     Right Ear: External ear normal.     Left Ear: External ear normal.     Nose: Nose normal.     Mouth/Throat:     Mouth: Mucous membranes are moist.  Eyes:     General: No scleral icterus.       Right eye: No discharge.        Left eye: No discharge.     Extraocular Movements: Extraocular movements intact.     Pupils: Pupils are equal, round, and reactive to light.  Cardiovascular:     Rate and Rhythm: Normal rate and regular rhythm.     Pulses: Normal pulses.     Heart sounds: Normal heart sounds.  Pulmonary:     Effort: Pulmonary effort is normal. No respiratory distress.     Breath sounds: Normal breath sounds. No stridor.  Abdominal:     General: Abdomen is flat. There is no distension.     Palpations: Abdomen is soft.     Tenderness: There is no abdominal tenderness.   Musculoskeletal:       Arms:     Cervical back: No rigidity.     Right lower leg: No edema.     Left lower leg: No edema.     Comments: Pelvis stable to AP pressure  No significant pain to logroll lower extremities  No crepitance or step-off to midline of back, she does have TTP to lumbar spine  Skin:    General: Skin is warm and dry.     Capillary Refill: Capillary refill takes less than 2 seconds.  Neurological:     Mental Status: She is alert and oriented to person, place, and time.     GCS: GCS eye subscore is 4. GCS verbal subscore is 5. GCS motor subscore  is 6.     Cranial Nerves: Cranial nerves 2-12 are intact.     Sensory: Sensation is intact.     Motor: Motor function is intact.     Coordination: Coordination is intact.     Comments: Gait testing deferred secondary to patient safety. Strength 5/5 to BLUE/BLLE, equal and symmetric     Psychiatric:        Mood and Affect: Mood normal.        Behavior: Behavior normal. Behavior is cooperative.     ED Results and Treatments Labs (all labs ordered are listed, but only abnormal results are displayed) Labs Reviewed  CBC WITH DIFFERENTIAL/PLATELET - Abnormal; Notable for the following components:      Result Value   Hemoglobin 11.7 (*)    Monocytes Absolute 1.1 (*)    All other components within normal limits  BASIC METABOLIC PANEL - Abnormal; Notable for the following components:   Sodium 132 (*)    Potassium 3.1 (*)    Chloride 92 (*)    Glucose, Bld 158 (*)    GFR, Estimated 59 (*)    All other components within normal limits                                                                                                                          Radiology CT Lumbar Spine Wo Contrast Result Date: 11/19/2023 CLINICAL DATA:  Low back pain, trauma.  Fall EXAM: CT LUMBAR SPINE WITHOUT CONTRAST TECHNIQUE: Multidetector CT imaging of the lumbar spine was performed without intravenous contrast administration.  Multiplanar CT image reconstructions were also generated. RADIATION DOSE REDUCTION: This exam was performed according to the departmental dose-optimization program which includes automated exposure control, adjustment of the mA and/or kV according to patient size and/or use of iterative reconstruction technique. COMPARISON:  X-ray lumbar spine 12/30/2021, CT abdomen pelvis 08/09/2021 FINDINGS: Segmentation: 5 lumbar type vertebrae. Alignment: Normal. Vertebrae: L1 vertebral body hemangioma. Similar-appearing chronic L4 superior endplate concavity. No acute fracture or focal pathologic process. Paraspinal and other soft tissues: Negative. Disc levels: Multilevel intervertebral disc space vacuum phenomenon. Other: Atherosclerotic plaque. Colonic diverticulosis. Interval development of a the 4.6 x 3 cm pancreatic tail mass. IMPRESSION: 1. No acute displaced fracture or traumatic listhesis of the lumbar spine. 2. Interval development of a the 4.6 x 3 cm pancreatic tail mass. Recommend MRI pancreatic protocol for further evaluation. When the patient is clinically stable and able to follow directions and hold their breath (preferably as an outpatient) further evaluation with dedicated abdominal MRI should be considered. 3.  Aortic Atherosclerosis (ICD10-I70.0). Electronically Signed   By: Tish Frederickson M.D.   On: 11/19/2023 00:36   CT Head Wo Contrast Result Date: 11/19/2023 CLINICAL DATA:  Head trauma, minor (Age >= 65y); Neck trauma (Age >= 65y) EXAM: CT HEAD WITHOUT CONTRAST CT CERVICAL SPINE WITHOUT CONTRAST TECHNIQUE: Multidetector CT imaging of the head and cervical spine was performed following the standard protocol without intravenous contrast. Multiplanar  CT image reconstructions of the cervical spine were also generated. RADIATION DOSE REDUCTION: This exam was performed according to the departmental dose-optimization program which includes automated exposure control, adjustment of the mA and/or kV  according to patient size and/or use of iterative reconstruction technique. COMPARISON:  CT head 06/24/2023. CT head 08/27/2021 FINDINGS: CT HEAD FINDINGS Brain: Cerebral ventricle sizes are concordant with the degree of cerebral volume loss. Patchy and confluent areas of decreased attenuation are noted throughout the deep and periventricular white matter of the cerebral hemispheres bilaterally, compatible with chronic microvascular ischemic disease. No evidence of large-territorial acute infarction. No parenchymal hemorrhage. Grossly similar-appearing 1.6 cm left infratentorial soft tissue mass. Grossly similar-appearing 1.7 x 1.3 cm partially calcified asymmetric right choroidal plexus with possible associated mass (3:15). No extra-axial collection. No mass effect or midline shift. No hydrocephalus. Basilar cisterns are patent. Vascular: No hyperdense vessel. Atherosclerotic calcifications are present within the cavernous internal carotid and vertebral arteries. Skull: No acute fracture or focal lesion. Sinuses/Orbits: Paranasal sinuses and mastoid air cells are clear. Bilateral lens replacement. Otherwise the orbits are unremarkable. Other: None. CT CERVICAL SPINE FINDINGS Alignment: Normal. Skull base and vertebrae: No acute fracture. No aggressive appearing focal osseous lesion or focal pathologic process. Soft tissues and spinal canal: No prevertebral fluid or swelling. No visible canal hematoma. Upper chest: Unremarkable. Other: Atherosclerotic plaque of the carotid arteries within the neck. IMPRESSION: 1. No acute intracranial abnormality. 2. No acute displaced fracture or traumatic listhesis of the cervical spine. 3. Grossly similar-appearing 1.6 cm left infratentorial soft tissue mass. 4. Grossly similar-appearing 1.7 x 1.3 cm partially calcified asymmetric right choroidal plexus with possible associated mass. Electronically Signed   By: Tish Frederickson M.D.   On: 11/19/2023 00:23   CT Cervical Spine Wo  Contrast Result Date: 11/19/2023 CLINICAL DATA:  Head trauma, minor (Age >= 65y); Neck trauma (Age >= 65y) EXAM: CT HEAD WITHOUT CONTRAST CT CERVICAL SPINE WITHOUT CONTRAST TECHNIQUE: Multidetector CT imaging of the head and cervical spine was performed following the standard protocol without intravenous contrast. Multiplanar CT image reconstructions of the cervical spine were also generated. RADIATION DOSE REDUCTION: This exam was performed according to the departmental dose-optimization program which includes automated exposure control, adjustment of the mA and/or kV according to patient size and/or use of iterative reconstruction technique. COMPARISON:  CT head 06/24/2023. CT head 08/27/2021 FINDINGS: CT HEAD FINDINGS Brain: Cerebral ventricle sizes are concordant with the degree of cerebral volume loss. Patchy and confluent areas of decreased attenuation are noted throughout the deep and periventricular white matter of the cerebral hemispheres bilaterally, compatible with chronic microvascular ischemic disease. No evidence of large-territorial acute infarction. No parenchymal hemorrhage. Grossly similar-appearing 1.6 cm left infratentorial soft tissue mass. Grossly similar-appearing 1.7 x 1.3 cm partially calcified asymmetric right choroidal plexus with possible associated mass (3:15). No extra-axial collection. No mass effect or midline shift. No hydrocephalus. Basilar cisterns are patent. Vascular: No hyperdense vessel. Atherosclerotic calcifications are present within the cavernous internal carotid and vertebral arteries. Skull: No acute fracture or focal lesion. Sinuses/Orbits: Paranasal sinuses and mastoid air cells are clear. Bilateral lens replacement. Otherwise the orbits are unremarkable. Other: None. CT CERVICAL SPINE FINDINGS Alignment: Normal. Skull base and vertebrae: No acute fracture. No aggressive appearing focal osseous lesion or focal pathologic process. Soft tissues and spinal canal: No  prevertebral fluid or swelling. No visible canal hematoma. Upper chest: Unremarkable. Other: Atherosclerotic plaque of the carotid arteries within the neck. IMPRESSION: 1. No acute intracranial abnormality. 2. No  acute displaced fracture or traumatic listhesis of the cervical spine. 3. Grossly similar-appearing 1.6 cm left infratentorial soft tissue mass. 4. Grossly similar-appearing 1.7 x 1.3 cm partially calcified asymmetric right choroidal plexus with possible associated mass. Electronically Signed   By: Tish Frederickson M.D.   On: 11/19/2023 00:23   DG Pelvis 1-2 Views Result Date: 11/18/2023 CLINICAL DATA:  Fall EXAM: PELVIS - 1-2 VIEW COMPARISON:  X-ray pelvis 06/24/2023 FINDINGS: Vague linear hypodense lucency along the superior pubic rami likely artifactual due to overlying overlying soft tissues with no definite acute nondisplaced fracture. There is no evidence of acute distal pelvic fracture or diastasis. No acute displaced fracture or dislocation of the hips on frontal view. No pelvic bone lesions are seen. Vascular calcifications. IMPRESSION: Negative for definite acute traumatic injury. Electronically Signed   By: Tish Frederickson M.D.   On: 11/18/2023 22:46   DG Chest 1 View Result Date: 11/18/2023 CLINICAL DATA:  [id number] Fall [id number] EXAM: CHEST  1 VIEW COMPARISON:  Chest x-ray 06/24/2023, CT chest 08/27/2021 FINDINGS: The heart and mediastinal contours are unchanged. Aortic calcification. No focal consolidation. No pulmonary edema. No pleural effusion. No pneumothorax. No acute osseous abnormality. IMPRESSION: 1. No active disease. 2.  Aortic Atherosclerosis (ICD10-I70.0). Electronically Signed   By: Tish Frederickson M.D.   On: 11/18/2023 22:41    Pertinent labs & imaging results that were available during my care of the patient were reviewed by me and considered in my medical decision making (see MDM for details).  Medications Ordered in ED Medications  potassium chloride SA (KLOR-CON M)  CR tablet 40 mEq (40 mEq Oral Given 11/19/23 0058)  acetaminophen (TYLENOL) tablet 1,000 mg (1,000 mg Oral Given 11/19/23 0058)                                                                                                                                     Procedures Procedures  (including critical care time)  Medical Decision Making / ED Course    Medical Decision Making:    Megan Flores is a 88 y.o. female with past medical history as below, significant for bipolar 1 disorder, essential tremor, hypertension, HLD who presents to the ED with complaint of fall. The complaint involves an extensive differential diagnosis and also carries with it a high risk of complications and morbidity.  Serious etiology was considered. Ddx includes but is not limited to: Fracture, dislocation, sprain, strain, ICH, etc.  Differential diagnosis includes but is not exclusive to musculoskeletal back pain, renal colic, urinary tract infection, pyelonephritis, intra-abdominal causes of back pain, aortic aneurysm or dissection, cauda equina syndrome, sciatica, lumbar disc disease, thoracic disc disease, etc.   Complete initial physical exam performed, notably the patient was in no acute distress, sitting comfortably on stretcher.    Reviewed and confirmed nursing documentation for past medical history, family history, social history.  Vital signs reviewed.  Clinical Course as of 11/19/23 0158  Fri Nov 19, 2023  1610 CT concerning for pancreatic mass, rads recommends o/p MRI  [SG]  0045 CTH with chronic changes roughly unchanged from prior imaging  [SG]    Clinical Course User Index [SG] Sloan Leiter, DO    Brief summary: 88 year old female from SNF here with a fall.  Patient denies LOC.  Apparent mechanical fall per patient descriptor.  She uses walker at baseline.  Slipped and fell to the ground.  No thinners, no head injury, no LOC.  Is complaining of some back pain, headache.   Screening imaging ordered in triage.  Labs also ordered in triage.  Notable for mild hypokalemia, replaced orally.  Labs otherwise stable.  Imaging reviewed, no fx. incidental finding noted to pancreas possible mass.  Recommend outpatient MRI.  Discussed findings with patient and spouse at bedside.  They will discuss with PCP to arrange outpatient follow-up imaging.  Spouse will take patient back to SNF  The patient improved significantly and was discharged in stable condition. Detailed discussions were had with the patient regarding current findings, and need for close f/u with PCP or on call doctor. The patient has been instructed to return immediately if the symptoms worsen in any way for re-evaluation. Patient verbalized understanding and is in agreement with current care plan. All questions answered prior to discharge.                Additional history obtained: -Additional history obtained from spouse -External records from outside source obtained and reviewed including: Chart review including previous notes, labs, imaging, consultation notes including  Home medication, prior ED visits, prior labs and imaging   Lab Tests: -I ordered, reviewed, and interpreted labs.   The pertinent results include:   Labs Reviewed  CBC WITH DIFFERENTIAL/PLATELET - Abnormal; Notable for the following components:      Result Value   Hemoglobin 11.7 (*)    Monocytes Absolute 1.1 (*)    All other components within normal limits  BASIC METABOLIC PANEL - Abnormal; Notable for the following components:   Sodium 132 (*)    Potassium 3.1 (*)    Chloride 92 (*)    Glucose, Bld 158 (*)    GFR, Estimated 59 (*)    All other components within normal limits    Notable for mild hypokalemia as above  EKG   EKG Interpretation Date/Time:    Ventricular Rate:    PR Interval:    QRS Duration:    QT Interval:    QTC Calculation:   R Axis:      Text Interpretation:           Imaging  Studies ordered: I ordered imaging studies including CT head, C-spine, lumbar spine, chest x-ray and pelvis x-ray I independently visualized the following imaging with scope of interpretation limited to determining acute life threatening conditions related to emergency care; findings noted above I independently visualized and interpreted imaging. I agree with the radiologist interpretation   Medicines ordered and prescription drug management: Meds ordered this encounter  Medications   potassium chloride SA (KLOR-CON M) CR tablet 40 mEq   acetaminophen (TYLENOL) tablet 1,000 mg    -I have reviewed the patients home medicines and have made adjustments as needed   Consultations Obtained: na   Cardiac Monitoring: Continuous pulse oximetry interpreted by myself, 97% on RA.    Social Determinants of Health:  Diagnosis or treatment significantly limited by social determinants of health: SNF  Reevaluation: After the interventions noted above, I reevaluated the patient and found that they have improved  Co morbidities that complicate the patient evaluation  Past Medical History:  Diagnosis Date   Asthma    Bipolar 1 disorder (HCC)    Breast cancer (HCC) 1998   left breast cancer/radiation   Cancer of breast (HCC)    Cancer of face (HCC)    Essential tremor 06/14/2013   HTN (hypertension)    Hypercholesteremia    Osteoarthritis    Peripheral neuropathy    Tremor       Dispostion: Disposition decision including need for hospitalization was considered, and patient discharged from emergency department.    Final Clinical Impression(s) / ED Diagnoses Final diagnoses:  Fall, initial encounter  Mass of pancreas        Sloan Leiter, DO 11/19/23 0158

## 2023-11-19 ENCOUNTER — Emergency Department (HOSPITAL_COMMUNITY): Payer: Medicare Other

## 2023-11-19 NOTE — Discharge Instructions (Addendum)
Your imaging was concerning for a mass in the tail of the pancreas. Please follow up with your PCP to obtain an MRI of your pancreas to further differentiate what this mass is.  Please call you PCP to help arrange this  It was a pleasure caring for you today in the emergency department.  Please return to the emergency department for any worsening or worrisome symptoms.

## 2023-12-14 ENCOUNTER — Encounter (HOSPITAL_COMMUNITY): Payer: Self-pay

## 2023-12-14 ENCOUNTER — Emergency Department (HOSPITAL_COMMUNITY): Payer: Medicare Other

## 2023-12-14 ENCOUNTER — Other Ambulatory Visit: Payer: Self-pay

## 2023-12-14 ENCOUNTER — Emergency Department (HOSPITAL_COMMUNITY)
Admission: EM | Admit: 2023-12-14 | Discharge: 2023-12-14 | Disposition: A | Discharge status: Skilled Nursing Facility | Payer: Medicare Other | Attending: Emergency Medicine | Admitting: Emergency Medicine

## 2023-12-14 ENCOUNTER — Emergency Department (HOSPITAL_BASED_OUTPATIENT_CLINIC_OR_DEPARTMENT_OTHER): Payer: Medicare Other

## 2023-12-14 ENCOUNTER — Other Ambulatory Visit (HOSPITAL_COMMUNITY): Payer: Self-pay

## 2023-12-14 DIAGNOSIS — Z7901 Long term (current) use of anticoagulants: Secondary | ICD-10-CM | POA: Insufficient documentation

## 2023-12-14 DIAGNOSIS — Z7982 Long term (current) use of aspirin: Secondary | ICD-10-CM | POA: Diagnosis not present

## 2023-12-14 DIAGNOSIS — J45909 Unspecified asthma, uncomplicated: Secondary | ICD-10-CM | POA: Diagnosis not present

## 2023-12-14 DIAGNOSIS — Z853 Personal history of malignant neoplasm of breast: Secondary | ICD-10-CM | POA: Diagnosis not present

## 2023-12-14 DIAGNOSIS — M7989 Other specified soft tissue disorders: Secondary | ICD-10-CM | POA: Diagnosis not present

## 2023-12-14 DIAGNOSIS — L03115 Cellulitis of right lower limb: Secondary | ICD-10-CM | POA: Insufficient documentation

## 2023-12-14 DIAGNOSIS — I10 Essential (primary) hypertension: Secondary | ICD-10-CM | POA: Insufficient documentation

## 2023-12-14 DIAGNOSIS — I824Y2 Acute embolism and thrombosis of unspecified deep veins of left proximal lower extremity: Secondary | ICD-10-CM | POA: Diagnosis not present

## 2023-12-14 DIAGNOSIS — R0602 Shortness of breath: Secondary | ICD-10-CM | POA: Diagnosis not present

## 2023-12-14 DIAGNOSIS — M79671 Pain in right foot: Secondary | ICD-10-CM | POA: Diagnosis present

## 2023-12-14 LAB — CBC WITH DIFFERENTIAL/PLATELET
Abs Immature Granulocytes: 0.05 10*3/uL (ref 0.00–0.07)
Basophils Absolute: 0 10*3/uL (ref 0.0–0.1)
Basophils Relative: 0 %
Eosinophils Absolute: 0.1 10*3/uL (ref 0.0–0.5)
Eosinophils Relative: 1 %
HCT: 36.1 % (ref 36.0–46.0)
Hemoglobin: 11.9 g/dL — ABNORMAL LOW (ref 12.0–15.0)
Immature Granulocytes: 1 %
Lymphocytes Relative: 16 %
Lymphs Abs: 1.5 10*3/uL (ref 0.7–4.0)
MCH: 29 pg (ref 26.0–34.0)
MCHC: 33 g/dL (ref 30.0–36.0)
MCV: 87.8 fL (ref 80.0–100.0)
Monocytes Absolute: 1.5 10*3/uL — ABNORMAL HIGH (ref 0.1–1.0)
Monocytes Relative: 15 %
Neutro Abs: 6.4 10*3/uL (ref 1.7–7.7)
Neutrophils Relative %: 67 %
Platelets: 132 10*3/uL — ABNORMAL LOW (ref 150–400)
RBC: 4.11 MIL/uL (ref 3.87–5.11)
RDW: 13.1 % (ref 11.5–15.5)
WBC: 9.5 10*3/uL (ref 4.0–10.5)
nRBC: 0 % (ref 0.0–0.2)

## 2023-12-14 LAB — BRAIN NATRIURETIC PEPTIDE: B Natriuretic Peptide: 15.8 pg/mL (ref 0.0–100.0)

## 2023-12-14 LAB — TROPONIN I (HIGH SENSITIVITY): Troponin I (High Sensitivity): 11 ng/L (ref <18)

## 2023-12-14 MED ORDER — DIVALPROEX SODIUM 250 MG PO DR TAB
250.0000 mg | DELAYED_RELEASE_TABLET | Freq: Once | ORAL | Status: AC
  Administered 2023-12-14: 250 mg via ORAL
  Filled 2023-12-14: qty 1

## 2023-12-14 MED ORDER — APIXABAN (ELIQUIS) VTE STARTER PACK (10MG AND 5MG): ORAL_TABLET | ORAL | 0 refills | Status: AC

## 2023-12-14 MED ORDER — CEPHALEXIN 250 MG PO CAPS
500.0000 mg | ORAL_CAPSULE | Freq: Once | ORAL | Status: AC
  Administered 2023-12-14: 500 mg via ORAL
  Filled 2023-12-14: qty 2

## 2023-12-14 MED ORDER — APIXABAN 5 MG PO TABS: 5.0000 mg | ORAL_TABLET | Freq: Two times a day (BID) | ORAL | Status: DC

## 2023-12-14 MED ORDER — APIXABAN 2.5 MG PO TABS: 2.5000 mg | ORAL_TABLET | Freq: Two times a day (BID) | ORAL | 0 refills | Status: DC

## 2023-12-14 MED ORDER — APIXABAN 5 MG PO TABS: 10.0000 mg | ORAL_TABLET | Freq: Two times a day (BID) | ORAL | Status: DC

## 2023-12-14 MED ORDER — CEPHALEXIN 500 MG PO CAPS: 500.0000 mg | ORAL_CAPSULE | Freq: Four times a day (QID) | ORAL | 0 refills | Status: AC

## 2023-12-14 MED ORDER — CEPHALEXIN 500 MG PO CAPS: 500.0000 mg | ORAL_CAPSULE | Freq: Four times a day (QID) | ORAL | 0 refills | Status: DC

## 2023-12-14 NOTE — Discharge Instructions (Addendum)
You have a blood clot in the left leg.  You were started on blood thinning medicine for this.  You also have cellulitis (skin infection) on the right leg.  Your foot has a blister filled with sterile fluid.  It is not recommended to pop this as that will increase the chance of infection.  If it pops on its own that is okay.  Take antibiotics as directed.  I have given you referral to a DVT clinic.  Follow-up with them.  For any emergent symptoms return to the emergency room.  Follow-up with your primary care provider.

## 2023-12-14 NOTE — Progress Notes (Signed)
BLE venous duplex has been completed.  Preliminary results given to Marita Kansas, PA-C.    Results can be found under chart review under CV PROC. 12/14/2023 1:04 PM Devantae Babe RVT, RDMS

## 2023-12-14 NOTE — Progress Notes (Signed)
PHARMACY - ANTICOAGULATION CONSULT NOTE  Pharmacy Consult for Eliquis Indication: DVT  Allergies  Allergen Reactions   Aspirin Other (See Comments)    ULCERS    Levofloxacin Other (See Comments)    Insomnia leading to mania (amoxicillin has always worked well for her)     Primidone Other (See Comments)    Dizziness    Propranolol Anaphylaxis and Other (See Comments)    Hypotension, also    Neomycin-Bacitracin-Polymyxin [Bacitracin-Neomycin-Polymyxin] Rash   Topamax [Topiramate] Other (See Comments)    Patient Measurements: Height: 5\' 5"  (165.1 cm) Weight: 75 kg (165 lb 5.5 oz) IBW/kg (Calculated) : 57  Vital Signs: Temp: 97.7 F (36.5 C) (01/21 1031) Temp Source: Oral (01/21 1031) BP: 111/78 (01/21 1031) Pulse Rate: 80 (01/21 1031)  Labs: Recent Labs    12/14/23 1250 12/14/23 1320  HGB  --  11.9*  HCT  --  36.1  PLT  --  132*  TROPONINIHS 11  --    CrCl cannot be calculated (Patient's most recent lab result is older than the maximum 21 days allowed.).  Medical History: Past Medical History:  Diagnosis Date   Asthma    Bipolar 1 disorder (HCC)    Breast cancer (HCC) 1998   left breast cancer/radiation   Cancer of breast (HCC)    Cancer of face (HCC)    Essential tremor 06/14/2013   HTN (hypertension)    Hypercholesteremia    Osteoarthritis    Peripheral neuropathy    Tremor    Medications:  Scheduled:  Infusions:   Assessment: 88YOF from [facility] assisted [facility] who presents with swollen and painful leg. BLE venous duplex with acute DVT involving the left common femoral vein, left femoral vein, SF junction, and left popliteal vein.  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 Reviewed order summary report from facility and confirmed not on Eliquis or other AC PTA.  Hgb 11.9, plt 132.  Goal of Therapy:  Monitor platelets by anticoagulation protocol: Yes   Plan:  Start Eliquis 10mg  PO BID x 7 days, then 5mg  PO BID Monitor CBC &  s/sx of bleeding  Jenita Seashore 12/14/2023,3:23 PM
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
## 2023-12-14 NOTE — ED Triage Notes (Addendum)
Pt to ED from [facility] assisted living via [place].  Pt to ED for right foot sore and swelling, unknown when it started.   Pt states she is only here because she was told to come. Pt has a swollen and painful right leg. Pt states this started last night. Pt has a large blister on right foot. Pt alert to self and place, disoriented to time and situation.   Cbg=221

## 2023-12-14 NOTE — ED Notes (Addendum)
Patient transported to [facility] 

## 2023-12-15 ENCOUNTER — Telehealth: Payer: Self-pay | Admitting: *Deleted

## 2023-12-15 NOTE — Telephone Encounter (Signed)
Pharmacy called regarding diagnosis for pt prescribed Eliquis.  RNCM reviewed the chart to find pt dx was DVT.

## 2023-12-20 ENCOUNTER — Other Ambulatory Visit: Payer: Self-pay

## 2023-12-20 ENCOUNTER — Emergency Department (HOSPITAL_COMMUNITY): Payer: Medicare Other

## 2023-12-20 ENCOUNTER — Encounter (HOSPITAL_COMMUNITY): Payer: Self-pay

## 2023-12-20 ENCOUNTER — Emergency Department (HOSPITAL_COMMUNITY)
Admission: EM | Admit: 2023-12-20 | Discharge: 2023-12-20 | Disposition: A | Discharge status: Home / Self Care | Payer: Medicare Other | Attending: Emergency Medicine | Admitting: Emergency Medicine

## 2023-12-20 DIAGNOSIS — S0003XA Contusion of scalp, initial encounter: Secondary | ICD-10-CM | POA: Insufficient documentation

## 2023-12-20 DIAGNOSIS — W19XXXA Unspecified fall, initial encounter: Secondary | ICD-10-CM | POA: Diagnosis not present

## 2023-12-20 DIAGNOSIS — G309 Alzheimer's disease, unspecified: Secondary | ICD-10-CM | POA: Insufficient documentation

## 2023-12-20 DIAGNOSIS — D72829 Elevated white blood cell count, unspecified: Secondary | ICD-10-CM | POA: Diagnosis not present

## 2023-12-20 DIAGNOSIS — Z7901 Long term (current) use of anticoagulants: Secondary | ICD-10-CM | POA: Diagnosis not present

## 2023-12-20 LAB — URINALYSIS, W/ REFLEX TO CULTURE (INFECTION SUSPECTED)
Bacteria, UA: NONE SEEN
Bilirubin Urine: NEGATIVE
Glucose, UA: 50 mg/dL — AB
Hgb urine dipstick: NEGATIVE
Ketones, ur: NEGATIVE mg/dL
Leukocytes,Ua: NEGATIVE
Nitrite: NEGATIVE
Protein, ur: NEGATIVE mg/dL
Specific Gravity, Urine: 1.018 (ref 1.005–1.030)
pH: 7 (ref 5.0–8.0)

## 2023-12-20 LAB — BASIC METABOLIC PANEL
Anion gap: 15 (ref 5–15)
BUN: 16 mg/dL (ref 8–23)
CO2: 22 mmol/L (ref 22–32)
Calcium: 9.3 mg/dL (ref 8.9–10.3)
Chloride: 90 mmol/L — ABNORMAL LOW (ref 98–111)
Creatinine, Ser: 0.76 mg/dL (ref 0.44–1.00)
GFR, Estimated: >60 mL/min (ref >60)
Glucose, Bld: 189 mg/dL — ABNORMAL HIGH (ref 70–99)
Potassium: 3.4 mmol/L — ABNORMAL LOW (ref 3.5–5.1)
Sodium: 127 mmol/L — ABNORMAL LOW (ref 135–145)

## 2023-12-20 LAB — CBC WITH DIFFERENTIAL/PLATELET
Abs Immature Granulocytes: 0.16 10*3/uL — ABNORMAL HIGH (ref 0.00–0.07)
Basophils Absolute: 0 10*3/uL (ref 0.0–0.1)
Basophils Relative: 0 %
Eosinophils Absolute: 0.2 10*3/uL (ref 0.0–0.5)
Eosinophils Relative: 1 %
HCT: 41 % (ref 36.0–46.0)
Hemoglobin: 13.3 g/dL (ref 12.0–15.0)
Immature Granulocytes: 1 %
Lymphocytes Relative: 10 %
Lymphs Abs: 1.5 10*3/uL (ref 0.7–4.0)
MCH: 28.8 pg (ref 26.0–34.0)
MCHC: 32.4 g/dL (ref 30.0–36.0)
MCV: 88.7 fL (ref 80.0–100.0)
Monocytes Absolute: 1.9 10*3/uL — ABNORMAL HIGH (ref 0.1–1.0)
Monocytes Relative: 13 %
Neutro Abs: 10.7 10*3/uL — ABNORMAL HIGH (ref 1.7–7.7)
Neutrophils Relative %: 75 %
Platelets: 201 10*3/uL (ref 150–400)
RBC: 4.62 MIL/uL (ref 3.87–5.11)
RDW: 13.4 % (ref 11.5–15.5)
WBC: 14.5 10*3/uL — ABNORMAL HIGH (ref 4.0–10.5)
nRBC: 0 % (ref 0.0–0.2)

## 2023-12-20 NOTE — ED Provider Notes (Signed)
[place] EMERGENCY DEPARTMENT AT [facility] Provider Note   CSN: [id number] Arrival date & time: 12/20/23  5284     History  Chief Complaint  Patient presents with   Megan Flores is a 89 y.o. female.  Patient with past medical history significant for bipolar 1 disorder, chronic hyponatremia, delirium due to multiple etiologies, DVT, UTI currently on Keflex presents to the emergency department via EMS for an evaluation post fall.  Patient reportedly found in the floor at her nursing facility.  Last seen in bed approximately 6 hours earlier.  Small abrasion noted to patient's posterior scalp.  No other obvious injuries upon arrival.  Patient has reportedly been altered for a few days and currently has no change in mental status.  Patient states "she hurts all over".  Patient has c-collar upon arrival.  Level 2 trauma due to apixaban usage.   Fall       Home Medications Prior to Admission medications   Medication Sig Start Date End Date Taking? Authorizing Provider  APIXABAN (ELIQUIS) VTE STARTER PACK (10MG  AND 5MG ) Take as directed on package: start with two-5mg  tablets twice daily for 7 days. On day 8, switch to one-5mg  tablet twice daily. 12/14/23   Marita Kansas, PA-C  Calcium Citrate-Vitamin D (CITRACAL + D PO) Take 1 tablet by mouth 2 (two) times daily.    [provider]  clonazePAM (KLONOPIN) 0.5 MG disintegrating tablet Take 0.5 mg by mouth daily as needed for seizure. Also has Clonazepam 0.5mg  scheduled    [provider]  divalproex (DEPAKOTE) 250 MG DR tablet Take 250 mg by mouth 3 (three) times daily.    [provider]  donepezil (ARICEPT) 5 MG tablet Take 5 mg by mouth at bedtime.    [provider]  hydrochlorothiazide (HYDRODIURIL) 25 MG tablet Take 25 mg by mouth daily.    [provider]  losartan (COZAAR) 25 MG tablet Take 25 mg by mouth daily. 12/18/20   [provider]  Melatonin 10 MG  TABS Take 10 mg by mouth at bedtime.    [provider]  QUEtiapine (SEROQUEL) 300 MG tablet Take 300 mg by mouth at bedtime.    [provider]  simvastatin (ZOCOR) 20 MG tablet Take 20 mg by mouth at bedtime.    [provider]      Allergies    Aspirin, Levofloxacin, Primidone, Propranolol, Neomycin-bacitracin-polymyxin [bacitracin-neomycin-polymyxin], and Topamax [topiramate]    Review of Systems   Review of Systems  Physical Exam Updated Vital Signs BP 110/62   Pulse 99   Temp 98 F (36.7 C) (Oral)   Resp 18   Ht 5\' 5"  (1.651 m)   Wt 75 kg   SpO2 100%   BMI 27.51 kg/m  Physical Exam Vitals and nursing note reviewed.  Constitutional:      General: She is not in acute distress.    Appearance: She is well-developed.  HENT:     Head: Normocephalic.     Comments: Abrasions/small hematoma noted to posterior scalp.  No active bleeding. Eyes:     Conjunctiva/sclera: Conjunctivae normal.     Pupils: Pupils are equal, round, and reactive to light.  Cardiovascular:     Rate and Rhythm: Normal rate and regular rhythm.     Heart sounds: No murmur heard. Pulmonary:     Effort: Pulmonary effort is normal. No respiratory distress.     Breath sounds: Normal breath sounds.  Abdominal:  Palpations: Abdomen is soft.     Tenderness: There is no abdominal tenderness.  Musculoskeletal:        General: No swelling.     Cervical back: Normal range of motion and neck supple. No tenderness.  Skin:    General: Skin is warm and dry.     Capillary Refill: Capillary refill takes less than 2 seconds.  Neurological:     Mental Status: She is alert.  Psychiatric:        Mood and Affect: Mood normal.     ED Results / Procedures / Treatments   Labs (all labs ordered are listed, but only abnormal results are displayed) Labs Reviewed  BASIC METABOLIC PANEL - Abnormal; Notable for the following components:      Result Value   Sodium 127 (*)    Potassium 3.4  (*)    Chloride 90 (*)    Glucose, Bld 189 (*)    All other components within normal limits  CBC WITH DIFFERENTIAL/PLATELET - Abnormal; Notable for the following components:   WBC 14.5 (*)    Neutro Abs 10.7 (*)    Monocytes Absolute 1.9 (*)    Abs Immature Granulocytes 0.16 (*)    All other components within normal limits  URINALYSIS, W/ REFLEX TO CULTURE (INFECTION SUSPECTED) - Abnormal; Notable for the following components:   APPearance CLOUDY (*)    Glucose, UA 50 (*)    All other components within normal limits    EKG None  Radiology DG Chest 2 View Result Date: 12/20/2023 CLINICAL DATA:  89 year old female status post fall. On blood thinners. Altered mental status. EXAM: CHEST - 2 VIEW COMPARISON:  Cervical spine CT today. Chest radiographs 12/14/2023 and earlier. Lumbar spine CT 11/19/2023. FINDINGS: AP and lateral views of the chest 0459 hours. Lower lung volumes. Mediastinal contours remain normal. Calcified aortic atherosclerosis. Visualized tracheal air column is within normal limits. Mild bilateral atelectasis with no pneumothorax, pulmonary edema, pleural effusion or consolidation. Osteopenia. Stable visualized osseous structures. Paucity of bowel gas. IMPRESSION: Low lung volumes with atelectasis. No acute traumatic injury identified. Electronically Signed   By: Odessa Fleming M.D.   On: 12/20/2023 05:41   CT Cervical Spine Wo Contrast Result Date: 12/20/2023 CLINICAL DATA:  89 year old female status post fall. On blood thinners. Altered mental status. EXAM: CT CERVICAL SPINE WITHOUT CONTRAST TECHNIQUE: Multidetector CT imaging of the cervical spine was performed without intravenous contrast. Multiplanar CT image reconstructions were also generated. RADIATION DOSE REDUCTION: This exam was performed according to the departmental dose-optimization program which includes automated exposure control, adjustment of the mA and/or kV according to patient size and/or use of iterative  reconstruction technique. COMPARISON:  Cervical spine CT 11/19/2023. FINDINGS: Alignment: Maintained cervical lordosis. Cervicothoracic junction alignment is within normal limits. Bilateral posterior element alignment is within normal limits. Skull base and vertebrae: Visualized skull base is intact. No atlanto-occipital dissociation. C1 and C2 appear intact and aligned. No acute osseous abnormality identified. Soft tissues and spinal canal: No prevertebral fluid or swelling. No visible canal hematoma. Right suboccipital scalp hematoma or contusion redemonstrated series 6, image 25. Calcified carotid atherosclerosis. 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 Disc levels:  Stable mild for age cervical spine degeneration. Upper chest: Stable visible upper thoracic vertebrae. Negative lung apices. Other: Head CT reported separately. IMPRESSION: 1. No acute traumatic injury identified in the cervical spine. 2. Mild right suboccipital scalp  hematoma or contusion. 3. Mild for age cervical spine degeneration. Stable chronic neck findings. Electronically Signed   By: Odessa Fleming M.D.   On: 12/20/2023 05:39   CT Head Wo Contrast Result Date: 12/20/2023 CLINICAL DATA:  89 year old female status post fall. On blood thinners. Altered mental status. EXAM: CT HEAD WITHOUT CONTRAST TECHNIQUE: Contiguous axial images were obtained from the base of the skull through the vertex without intravenous contrast. RADIATION DOSE REDUCTION: This exam was performed according to the departmental dose-optimization program which includes automated exposure control, adjustment of the mA and/or kV according to patient size and/or use of iterative reconstruction technique. COMPARISON:  Brain MRI 02/06/2010.  Head CT 01/19/2023. FINDINGS: Brain: Chronic volume loss with probably ex vacuo related ventricular enlargement, stable. No midline shift, mass  intracranial mass effect. No acute intracranial hemorrhage identified. Chronic right side choroid plexus cyst, normal variant. There is a chronic right tentorial based meningioma, approximately 16 mm long axis and not significantly changed from 2011 (coronal image 15). Mild chronic mass effect on the left superior cerebellum with no edema. Stable gray-white matter differentiation throughout the brain. No cortically based acute infarct identified. Vascular: Calcified atherosclerosis at the skull base. No suspicious intracranial vascular hyperdensity. Skull: Stable.  No fracture identified. Sinuses/Orbits: Visualized paranasal sinuses and mastoids are stable and well aerated. Other: Mild right suboccipital broad-based scalp hematoma or contusion best seen on series 4, image 9. No other acute orbit or scalp soft tissue injury identified. IMPRESSION: 1. Mild right suboccipital scalp hematoma or contusion. No skull fracture identified. 2. No acute intracranial abnormality. Chronic left tentorial Meningioma. Cerebral Atrophy (ICD10-G31.9). Electronically Signed   By: Odessa Fleming M.D.   On: 12/20/2023 05:35    Procedures .Critical Care  Performed by: Darrick Grinder, PA-C Authorized by: Darrick Grinder, PA-C   Critical care provider statement:    Critical care time (minutes):  30   Critical care time was exclusive of:  Separately billable procedures and treating other patients   Critical care was necessary to treat or prevent imminent or life-threatening deterioration of the following conditions:  Trauma   Critical care was time spent personally by me on the following activities:  Development of treatment plan with patient or surrogate, discussions with consultants, evaluation of patient's response to treatment, examination of patient, ordering and review of laboratory studies, ordering and review of radiographic studies, ordering and performing treatments and interventions, pulse oximetry, re-evaluation of  patient's condition and review of old charts   Care discussed with: admitting provider       Medications Ordered in ED Medications - No data to display  ED Course/ Medical Decision Making/ A&P                                 Medical Decision Making Amount and/or Complexity of Data Reviewed Labs: ordered. Radiology: ordered.   This patient presents to the ED for concern of fall, this involves an extensive number of treatment options, and is a complaint that carries with it a high risk of complications and morbidity.  The differential diagnosis includes intracranial abnormality, fracture, dislocation, infection, others   Co morbidities that complicate the patient evaluation  UTI, DVT, Eliquis usage   Additional history obtained:  Additional history obtained from EMS and patient's husband.  Patient's husband states that the patient does have Alzheimer's disease. External records from outside source obtained and reviewed including summary from nursing facility  Lab Tests:  I Ordered, and personally interpreted labs.  The pertinent results include: Leukocytosis with white count of 14,500, no sign of UTI   Imaging Studies ordered:  I ordered imaging studies including CT head and cervical spine without contrast, chest x-ray I independently visualized and interpreted imaging which showed  1. Mild right suboccipital scalp hematoma or contusion. No skull  fracture identified.  2. No acute intracranial abnormality. Chronic left tentorial  Meningioma. Cerebral Atrophy   No acute findings on cervical spine imaging or chest imaging I agree with the radiologist interpretation   Social Determinants of Health:  Patient resides in a nursing facility, reportedly has Alzheimer's   Test / Admission - Considered:  Patient with no significant acute findings on imaging or labs. No UTI noted. Small abrasion w/ hemotoma noted to scalp. No indication for further emergent workup. Patient  stable for discharge back to her facility.          Final Clinical Impression(s) / ED Diagnoses Final diagnoses:  Fall, initial encounter  Scalp hematoma, initial encounter    Rx / DC Orders ED Discharge Orders     None         Pamala Duffel 12/20/23 9629    Dione Booze, MD 12/20/23 [phone]

## 2023-12-20 NOTE — ED Notes (Signed)
Trauma Response Nurse Documentation   Megan Flores is a 89 y.o. female arriving to [facility] ED via [facility]  On Eliquis (apixaban) daily. Trauma was activated as a Level 2 by Clelia Schaumann based on the following trauma criteria Elderly patients > 65 with head trauma on anti-coagulation (excluding ASA).  Patient cleared for CT by Dr. Preston Fleeting. Pt transported to CT with trauma response nurse present to monitor. RN remained with the patient throughout their absence from the department for clinical observation.   GCS 14.  Trauma MD Arrival Time: N/A.  History   Past Medical History:  Diagnosis Date   Asthma    Bipolar 1 disorder (HCC)    Breast cancer (HCC) 1998   left breast cancer/radiation   Cancer of breast (HCC)    Cancer of face (HCC)    Essential tremor 06/14/2013   HTN (hypertension)    Hypercholesteremia    Osteoarthritis    Peripheral neuropathy    Tremor      Past Surgical History:  Procedure Laterality Date   BREAST LUMPECTOMY Left 1998   pos   ORIF TIBIAL SHAFT FRACTURE W/ PLATES AND SCREWS Left    VAGINAL HYSTERECTOMY         Initial Focused Assessment (If applicable, or please see trauma documentation): Airway-- intact, no visible obstruction Breathing-- spontaneous, unlabored Circulation-- no obvious bleeding noted  CT's Completed:   CT Head and CT C-Spine   Interventions:  See event summary  Plan for disposition:  Discharge home   Consults completed:  none at 0646.  Event Summary: Patient brought in by [facility]. Patient from a facility, unwitnessed fall 6 hours prior. Patient complaining of generalized pain everywhere. Patient transferred from EMS stretcher to hospital stretcher. Manual BP obtained. Lab work obtained. Patient to CT with TRN. CT head and c-spine completed. Patient to x-ray with TRN. X-ray chest completed.   MTP Summary (If applicable):  N/A  Bedside handoff with ED RN D'Erika.    Leota Sauers   Trauma Response RN  Please call TRN at [phone] for further assistance.

## 2023-12-20 NOTE — Discharge Instructions (Addendum)
Your imaging was reassuring.  You do have a small abrasion and hematoma on your scalp which will heal without any further intervention.  There were no signs of urinary tract infection on your urinalysis today.  Please follow-up with your primary team as needed for further evaluation.

## 2023-12-20 NOTE — ED Notes (Signed)
Patient transported to CT with RN

## 2024-01-22 DEATH — deceased
